# Patient Record
Sex: Female | Born: 1985 | Race: White | Hispanic: No | Marital: Married | Smoking: Former smoker
Health system: Southern US, Community
[De-identification: ages and names within clinical notes are randomized; demographics above are authoritative.]

## PROBLEM LIST (undated history)

## (undated) DIAGNOSIS — D561 Beta thalassemia: Secondary | ICD-10-CM

## (undated) DIAGNOSIS — T8859XA Other complications of anesthesia, initial encounter: Secondary | ICD-10-CM

## (undated) DIAGNOSIS — Z8679 Personal history of other diseases of the circulatory system: Secondary | ICD-10-CM

## (undated) DIAGNOSIS — O139 Gestational [pregnancy-induced] hypertension without significant proteinuria, unspecified trimester: Secondary | ICD-10-CM

## (undated) DIAGNOSIS — Q250 Patent ductus arteriosus: Secondary | ICD-10-CM

## (undated) DIAGNOSIS — R112 Nausea with vomiting, unspecified: Secondary | ICD-10-CM

## (undated) DIAGNOSIS — Z9889 Other specified postprocedural states: Secondary | ICD-10-CM

## (undated) DIAGNOSIS — B002 Herpesviral gingivostomatitis and pharyngotonsillitis: Secondary | ICD-10-CM

## (undated) DIAGNOSIS — J45909 Unspecified asthma, uncomplicated: Secondary | ICD-10-CM

## (undated) HISTORY — PX: TONSILLECTOMY: SUR1361

## (undated) HISTORY — DX: Unspecified asthma, uncomplicated: J45.909

## (undated) HISTORY — DX: Nausea with vomiting, unspecified: R11.2

## (undated) HISTORY — DX: Patent ductus arteriosus: Q25.0

## (undated) HISTORY — DX: Other complications of anesthesia, initial encounter: T88.59XA

## (undated) HISTORY — DX: Beta thalassemia: D56.1

## (undated) HISTORY — PX: CARDIAC SURGERY: SHX584

## (undated) HISTORY — PX: OTHER SURGICAL HISTORY: SHX169

## (undated) HISTORY — DX: Herpesviral gingivostomatitis and pharyngotonsillitis: B00.2

## (undated) HISTORY — DX: Gestational (pregnancy-induced) hypertension without significant proteinuria, unspecified trimester: O13.9

## (undated) HISTORY — DX: Personal history of other diseases of the circulatory system: Z86.79

## (undated) HISTORY — DX: Other specified postprocedural states: Z98.890

## (undated) HISTORY — PX: LAPAROSCOPY: SHX197

## (undated) HISTORY — PX: BUNIONECTOMY: SHX129

---

## 2016-02-18 DIAGNOSIS — O139 Gestational [pregnancy-induced] hypertension without significant proteinuria, unspecified trimester: Secondary | ICD-10-CM

## 2019-02-05 NOTE — L&D Delivery Note (Signed)
Alicia Allen is a 34 y.o. G2P1001 s/p SVD at [redacted]w[redacted]d. She was admitted for elective IOL.   ROM: 3h 93m with clear fluid GBS Status: Negative Maximum Maternal Temperature: 98.5  Labor Progress and Delivery: . She progressed with augmentation (cytotec and FB) to complete and pushed 48 minutes to deliver. Head delivered LOA. Nuchal cord present- loose, delivered through. Shoulder and body delivered in usual fashion. Infant with spontaneous cry, placed on mother's abdomen, dried and stimulated. Cord clamped x 2 after 1-minute delay, and cut by FOB-Chad. Cord blood drawn. Placenta delivered spontaneously with gentle cord traction. Fundus firm with massage and Pitocin. Labia, perineum, vagina, and cervix inspected inspected with 1st degree laceration. Laceration repaired without difficulty.  Mom and baby stable prior to transfer to postpartum. She plans on breastfeeding. She is unsure method for birth control.  Delivery Note At 4:08 AM a viable and healthy female was delivered via Vaginal, Spontaneous (Presentation: Left Occiput Anterior).  APGAR: 9, 9; weight pending.   Placenta status: Spontaneous, Intact.  Cord: 3 vessels with the following complications: None.   Anesthesia: Epidural Episiotomy: None Lacerations: 1st degree Suture Repair: 3.0 vicryl Est. Blood Loss (mL): 125  Mom to postpartum.  Baby to Couplet care / Skin to Skin.  Sharyon Cable CNM 12/13/2019, 4:45 AM

## 2019-05-13 ENCOUNTER — Encounter: Payer: Self-pay | Admitting: *Deleted

## 2019-05-14 ENCOUNTER — Other Ambulatory Visit (HOSPITAL_COMMUNITY)
Admission: RE | Admit: 2019-05-14 | Discharge: 2019-05-14 | Disposition: A | Discharge status: Home / Self Care | Payer: PRIVATE HEALTH INSURANCE | Source: Ambulatory Visit

## 2019-05-14 ENCOUNTER — Other Ambulatory Visit: Payer: Self-pay

## 2019-05-14 ENCOUNTER — Ambulatory Visit (INDEPENDENT_AMBULATORY_CARE_PROVIDER_SITE_OTHER): Payer: PRIVATE HEALTH INSURANCE

## 2019-05-14 VITALS — BP 120/71 | HR 74 | Temp 98.5°F | Ht 63.0 in | Wt 174.0 lb

## 2019-05-14 DIAGNOSIS — O99011 Anemia complicating pregnancy, first trimester: Secondary | ICD-10-CM

## 2019-05-14 DIAGNOSIS — Z3480 Encounter for supervision of other normal pregnancy, unspecified trimester: Secondary | ICD-10-CM | POA: Insufficient documentation

## 2019-05-14 DIAGNOSIS — O219 Vomiting of pregnancy, unspecified: Secondary | ICD-10-CM

## 2019-05-14 DIAGNOSIS — O99891 Other specified diseases and conditions complicating pregnancy: Secondary | ICD-10-CM

## 2019-05-14 DIAGNOSIS — O0990 Supervision of high risk pregnancy, unspecified, unspecified trimester: Secondary | ICD-10-CM

## 2019-05-14 DIAGNOSIS — R7989 Other specified abnormal findings of blood chemistry: Secondary | ICD-10-CM

## 2019-05-14 DIAGNOSIS — Z3A09 9 weeks gestation of pregnancy: Secondary | ICD-10-CM

## 2019-05-14 DIAGNOSIS — D561 Beta thalassemia: Secondary | ICD-10-CM

## 2019-05-14 DIAGNOSIS — Z124 Encounter for screening for malignant neoplasm of cervix: Secondary | ICD-10-CM | POA: Diagnosis not present

## 2019-05-14 DIAGNOSIS — O99411 Diseases of the circulatory system complicating pregnancy, first trimester: Secondary | ICD-10-CM

## 2019-05-14 DIAGNOSIS — I456 Pre-excitation syndrome: Secondary | ICD-10-CM

## 2019-05-14 LAB — OB RESULTS CONSOLE GC/CHLAMYDIA: Gonorrhea: NEGATIVE

## 2019-05-14 MED ORDER — DOXYLAMINE-PYRIDOXINE 10-10 MG PO TBEC: 2.0000 | DELAYED_RELEASE_TABLET | Freq: Every day | ORAL | 2 refills | Status: DC

## 2019-05-14 NOTE — Patient Instructions (Signed)

## 2019-05-14 NOTE — Progress Notes (Signed)
Bedside U/S shows single IUP with FHT of 164BPM and CRL measures 24.58mm  GA [redacted]w[redacted]d

## 2019-05-14 NOTE — Progress Notes (Signed)
Subjective:   Alicia Allen is a 34 y.o. G2P1000 at [redacted]w[redacted]d by [employer] being seen today for her first obstetrical visit.  Her obstetrical history is significant for pregnancy induced hypertension and "some low fluid after 24 weeks."  She reports a medical history significant for beta thalassemia and [place] white syndrome.  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  She states that when they went for repeat surgery in 2016 no findings were noted to be removed.  In regards to current symptoms, patient no pain or discomfort with urination or current diarrhea or constipation.  She states that she had "bad" constipation the first weeks of pregnancy, but that has since resolved. States bowel movement last night without issues.  Patient also reports nausea and vomiting and requests medication. Patient reports she is eating bland food, ginger ale, and crackers and continues to have nausea with eating, but limited vomiting "only 3x." She reports having some spotting that lasted one week, that started 2 days after sex. She endorses mild cramping, that did not require treatment. Patient states the blood ranged from red to brown to pink. No pad or pantyliner needed, just with wiping. Patient reports she also had some lightheadedness and heart palpitations, but this it may be work related.    Patient does intend to breast feed. Birth control method not addressed.  She is married to [place] who is supportive and has a 3 y.o. son Lyn Hollingshead.  Patient endorses safety at home and denies DVA.  She is currently employed at [employer] and reports things have been busy.   HISTORY: OB History  Gravida Para Term Preterm AB Living  2 1 1  0 0 0  SAB TAB Ectopic Multiple Live Births  0 0 0 0 1    # Outcome Date GA Lbr Len/2nd Weight Sex Delivery Anes PTL Lv  2 Current           1 Term             Last pap smear was done today.   Patient denies history of abnormal paps.   Past Medical History:  Diagnosis Date  . Asthma   . Beta thalassemia (HCC)   . Gestational hypertension   . History of Wolff-Parkinson-White syndrome    Past Surgical History:  Procedure Laterality Date  . BUNIONECTOMY    . CARDIAC SURGERY    . LAPAROSCOPY    . patent artero    . TONSILLECTOMY     Family History  Problem Relation Age of Onset  . Thalassemia Mother        Beta  . Multiple sclerosis Father   . Heart disease Father        Enlarged heart  . Breast cancer Maternal Grandmother   . Hypertension Maternal Grandmother   . Hypercholesterolemia Maternal Grandmother   . Heart attack Maternal Grandfather        multiple  . Diabetes Maternal Grandfather   . Breast cancer Paternal Grandmother   . Skin cancer Paternal Grandmother   . Huntington's disease Paternal Grandmother   . Fibromyalgia Paternal Grandmother   . Heart disease Paternal Grandfather    Social History   Tobacco Use  . Smoking status: Former  . Smokeless tobacco: Never Used  Substance Use Topics  . Alcohol use: Never  . Drug use: Never   Allergies  Allergen  Reactions  . Metoclopramide Anxiety    Severe panic attacks   Severe panic attacks      Current Outpatient Medications on File Prior to Visit  Medication Sig Dispense Refill  . acetaminophen (TYLENOL) 325 MG tablet Take by mouth.    Marland Kitchen albuterol (VENTOLIN HFA) 108 (90 Base) MCG/ACT inhaler Inhale into the lungs.    . folic acid (FOLVITE) 800 MCG tablet Take 400 mcg by mouth daily.    . Prenatal Vit-Fe Fumarate-FA (PRENATAL VITAMIN PO) Take by mouth.    . valACYclovir (VALTREX) 1000 MG tablet SMARTSIG:2 Tablet(s) By Mouth Every 12 Hours     No current facility-administered medications on file prior to visit.    Review of Systems Pertinent items noted in HPI and remainder of comprehensive ROS otherwise negative.  Exam   Vitals:   05/14/19 1016 05/14/19 1019  BP: 120/71   Pulse: 74    Temp: 98.5 F (36.9 C)   Weight: 174 lb (78.9 kg)   Height:  5\' 3"  (1.6 m)   Fetal Heart Rate (bpm): 164  Physical Exam Constitutional:      Appearance: Normal appearance.  Genitourinary:     Vulva normal.     Vaginal discharge present.     No vaginal erythema or bleeding.     Cervix is parous.     Cervical friability present.     No cervical motion tenderness, discharge, erythema, polyp or nabothian cyst.     Uterus is enlarged.     Genitourinary Comments: Small amt thin white discharge. Pap collected with brush and spatula causing bright red bleeding c/w friability.  No tenderness in cul de sac or adnexa area with BME. Size c/w dates.   HENT:     Head: Normocephalic and atraumatic.  Eyes:     Conjunctiva/sclera: Conjunctivae normal.  Neck:     Thyroid: No thyroid mass, thyromegaly or thyroid tenderness.     Trachea: Trachea normal. No tracheal tenderness.  Cardiovascular:     Rate and Rhythm: Normal rate and regular rhythm.     Heart sounds: Normal heart sounds.  Pulmonary:     Effort: Pulmonary effort is normal.     Breath sounds: Normal breath sounds.  Chest:     Breasts:        Right: Tenderness present. No mass, nipple discharge or skin change.        Left: Tenderness present. No mass, nipple discharge or skin change.     Comments: Tenderness c/w early pregnancy.  Abdominal:     General: Bowel sounds are normal.     Palpations: Abdomen is soft.     Tenderness: There is abdominal tenderness in the left lower quadrant. There is no guarding or rebound.  Musculoskeletal:     Cervical back: Normal range of motion.  Neurological:     Mental Status: She is alert.     Assessment:   34 y.o. year old G2P1000 Patient Active Problem List   Diagnosis Date Noted  . Supervision of high risk pregnancy, antepartum 05/14/2019     Plan:  1. Supervision of high risk pregnancy, antepartum -Congratulations given and patient welcomed to practice. -Discussed usage of  Babyscripts and virtual visits as additional source of managing and completing PN visits in midst of coronavirus.   -Anticipatory guidance for prenatal visits including labs, ultrasounds, and testing; Initial labs drawn. -Genetic Screening discussed, First trimester screen, Quad screen and NIPS: undecided. -Encouraged to complete MyChart Registration for her ability to review results,  send requests, and have questions addressed.  -Discussed estimated due date based of December 12, 2019. -Ultrasound discussed; fetal anatomic survey: discussed. -Continue prenatal vitamins.  -Encouraged to seek out care at office or emergency room for urgent and/or emergent concerns. -Educated on the nature of [place] with multiple MDs and other Advanced Practice Providers was explained to patient; also emphasized that residents, students are part of our team. Informed of her right to refuse care as she deems appropriate.  -No questions or concerns.   2. Cervical cancer screening -Pap collected -Informed that results would be released to mychart if activated.  If not, no news is good news.  3. Nausea and vomiting in pregnancy -Given information for morning sickness. -Rx for diclegis sent to pharmacy on file.   4. Beta thalassemia ([place]) -Reviewed how this can contribute to anemia in pregnancy. -Will monitor as appropriate and initiate iron supplements as needed. -Informed that lightheadedness and heart palpitations could also be due to low hgb.  5. Wolff-Parkinson-White (WPW) syndrome -Informed that provider not familiar with this syndrome and that it would require MD consult. -Patient questions if her pregnancy will be considered high risk and informed that currently it is a moderate to high risk d/t history. -Attempted to contact C. Pickens for phone consult, but unavailable. -Patient scheduled for ROB via VV with Dr. Loni Muse in 6 weeks. -Chart to be sent in advance for  review and mgmt as appropriate.  Problem list reviewed and updated. Routine obstetric precautions reviewed.  Orders Placed This Encounter  Procedures  . [place] bedside    Standing Status:   Future    Standing Expiration Date:   05/12/2020  . Culture, OB Urine  . Obstetric panel  . HIV antibody (with reflex)  . Hepatitis C Antibody  . Comprehensive metabolic panel  . Protein / creatinine ratio, urine    Return in about 6 weeks (around 06/25/2019) for LR-ROB via Virtual Visit.     Maryann Conners, CNM 05/14/2019 11:05 AM

## 2019-05-16 LAB — CULTURE, OB URINE

## 2019-05-16 LAB — URINE CULTURE, OB REFLEX: Organism ID, Bacteria: NO GROWTH

## 2019-05-17 ENCOUNTER — Other Ambulatory Visit: Payer: Self-pay | Admitting: *Deleted

## 2019-05-17 DIAGNOSIS — I456 Pre-excitation syndrome: Secondary | ICD-10-CM

## 2019-05-17 LAB — OBSTETRIC PANEL
Absolute Monocytes: 584 cells/uL (ref 200–950)
Antibody Screen: NOT DETECTED
Basophils Absolute: 79 cells/uL (ref 0–200)
Basophils Relative: 0.8 %
Eosinophils Absolute: 317 cells/uL (ref 15–500)
Eosinophils Relative: 3.2 %
HCT: 34.3 % — ABNORMAL LOW (ref 35.0–45.0)
Hemoglobin: 10.7 g/dL — ABNORMAL LOW (ref 11.7–15.5)
Hepatitis B Surface Ag: NONREACTIVE
Lymphs Abs: 2336 cells/uL (ref 850–3900)
MCH: 19.5 pg — ABNORMAL LOW (ref 27.0–33.0)
MCHC: 31.2 g/dL — ABNORMAL LOW (ref 32.0–36.0)
MCV: 62.4 fL — ABNORMAL LOW (ref 80.0–100.0)
MPV: 10.8 fL (ref 7.5–12.5)
Monocytes Relative: 5.9 %
Neutro Abs: 6584 cells/uL (ref 1500–7800)
Neutrophils Relative %: 66.5 %
Platelets: 351 10*3/uL (ref 140–400)
RBC: 5.5 10*6/uL — ABNORMAL HIGH (ref 3.80–5.10)
RDW: 16.2 % — ABNORMAL HIGH (ref 11.0–15.0)
RPR Ser Ql: NONREACTIVE
Rubella: 3.24 Index
Total Lymphocyte: 23.6 %
WBC: 9.9 10*3/uL (ref 3.8–10.8)

## 2019-05-17 LAB — GC/CHLAMYDIA PROBE AMP (~~LOC~~) NOT AT ARMC
Chlamydia: NEGATIVE
Comment: NEGATIVE
Comment: NORMAL
Neisseria Gonorrhea: NEGATIVE

## 2019-05-17 LAB — COMPREHENSIVE METABOLIC PANEL
AG Ratio: 1.7 (calc) (ref 1.0–2.5)
ALT: 11 U/L (ref 6–29)
AST: 13 U/L (ref 10–30)
Albumin: 4.4 g/dL (ref 3.6–5.1)
Alkaline phosphatase (APISO): 50 U/L (ref 31–125)
BUN/Creatinine Ratio: 26 (calc) — ABNORMAL HIGH (ref 6–22)
BUN: 11 mg/dL (ref 7–25)
CO2: 19 mmol/L — ABNORMAL LOW (ref 20–32)
Calcium: 9.2 mg/dL (ref 8.6–10.2)
Chloride: 103 mmol/L (ref 98–110)
Creat: 0.42 mg/dL — ABNORMAL LOW (ref 0.50–1.10)
Globulin: 2.6 g/dL (calc) (ref 1.9–3.7)
Glucose, Bld: 53 mg/dL — ABNORMAL LOW (ref 65–99)
Potassium: 3.7 mmol/L (ref 3.5–5.3)
Sodium: 136 mmol/L (ref 135–146)
Total Bilirubin: 0.5 mg/dL (ref 0.2–1.2)
Total Protein: 7 g/dL (ref 6.1–8.1)

## 2019-05-17 LAB — HEPATITIS C ANTIBODY
Hepatitis C Ab: NONREACTIVE
SIGNAL TO CUT-OFF: 0.01 (ref <1.00)

## 2019-05-17 LAB — PROTEIN / CREATININE RATIO, URINE
Creatinine, Urine: 128 mg/dL (ref 20–275)
Protein/Creat Ratio: 70 mg/g creat (ref 21–161)
Protein/Creatinine Ratio: 0.07 mg/mg creat (ref 0.021–0.16)
Total Protein, Urine: 9 mg/dL (ref 5–24)

## 2019-05-17 LAB — HIV ANTIBODY (ROUTINE TESTING W REFLEX): HIV 1&2 Ab, 4th Generation: NONREACTIVE

## 2019-05-17 LAB — SPECIMEN COMPROMISED

## 2019-05-17 NOTE — Progress Notes (Signed)
Ambulatory referral to cardiology per Gerrit Heck CNM

## 2019-05-19 ENCOUNTER — Encounter: Payer: Self-pay | Admitting: Obstetrics & Gynecology

## 2019-05-19 DIAGNOSIS — O99019 Anemia complicating pregnancy, unspecified trimester: Secondary | ICD-10-CM | POA: Insufficient documentation

## 2019-05-19 DIAGNOSIS — O99011 Anemia complicating pregnancy, first trimester: Secondary | ICD-10-CM | POA: Insufficient documentation

## 2019-05-19 LAB — CYTOLOGY - PAP
Comment: NEGATIVE
Diagnosis: NEGATIVE
High risk HPV: NEGATIVE

## 2019-05-20 DIAGNOSIS — R7989 Other specified abnormal findings of blood chemistry: Secondary | ICD-10-CM | POA: Insufficient documentation

## 2019-05-20 MED ORDER — FERROUS SULFATE 325 (65 FE) MG PO TBEC: 325.0000 mg | DELAYED_RELEASE_TABLET | Freq: Every day | ORAL | 3 refills | Status: DC

## 2019-05-20 NOTE — Addendum Note (Signed)
Addended by: Gerrit Heck L on: 05/20/2019 09:09 AM   Modules accepted: Orders

## 2019-06-21 ENCOUNTER — Encounter: Payer: Self-pay | Admitting: Obstetrics & Gynecology

## 2019-06-21 ENCOUNTER — Ambulatory Visit (INDEPENDENT_AMBULATORY_CARE_PROVIDER_SITE_OTHER): Payer: PRIVATE HEALTH INSURANCE | Admitting: Obstetrics & Gynecology

## 2019-06-21 ENCOUNTER — Other Ambulatory Visit: Payer: Self-pay

## 2019-06-21 VITALS — BP 122/70 | HR 74 | Wt 179.0 lb

## 2019-06-21 DIAGNOSIS — O0990 Supervision of high risk pregnancy, unspecified, unspecified trimester: Secondary | ICD-10-CM

## 2019-06-21 DIAGNOSIS — I456 Pre-excitation syndrome: Secondary | ICD-10-CM

## 2019-06-21 DIAGNOSIS — Z3A15 15 weeks gestation of pregnancy: Secondary | ICD-10-CM

## 2019-06-21 NOTE — Progress Notes (Signed)
   PRENATAL VISIT NOTE  Subjective:  Alicia Allen is a 34 y.o. G2P1000 at [redacted]w[redacted]d being seen today for ongoing prenatal care.  She is currently monitored for the following issues for this high-risk pregnancy and has Supervision of high risk pregnancy, antepartum; Wolff-Parkinson-White (WPW) syndrome; Beta thalassemia (HCC); Anemia affecting pregnancy in first trimester; and Low serum creatinine on their problem list.  Patient reports feeling lightheaded and having palpitations.   . Vag. Bleeding: None.  Movement: Absent. Denies leaking of fluid.   The following portions of the patient's history were reviewed and updated as appropriate: allergies, current medications, past family history, past medical history, past social history, past surgical history and problem list.   Objective:   Vitals:   06/21/19 1402  BP: 122/70  Pulse: 74  Weight: 179 lb (81.2 kg)    Fetal Status:     Movement: Absent     General:  Alert, oriented and cooperative. Patient is in no acute distress.  Skin: Skin is warm and dry. No rash noted.   Cardiovascular: Normal heart rate noted  Respiratory: Normal respiratory effort, no problems with respiration noted  Abdomen: Soft, gravid, appropriate for gestational age.        Pelvic: Cervical exam deferred        Extremities: Normal range of motion.  Edema: None  Mental Status: Normal mood and affect. Normal behavior. Normal judgment and thought content.   Assessment and Plan:  Pregnancy: G2P1000 at [redacted]w[redacted]d 1. Wolff-Parkinson-White (WPW) syndrome Already referred to Cardiology, has appointment on 07/07/2019 with Dr. Jens Som. Will also evaluate for reported occasional palpitations.  2. [redacted] weeks gestation of pregnancy Genetic screening, AFP checked today. - Genetic Screening - Alpha fetoprotein, maternal  3. Supervision of high risk pregnancy, antepartum Anatomy scan scheduled. - Genetic Screening - Alpha fetoprotein, maternal - [place] MFM OB COMP + 14 WK; Future No  other complaints or concerns.  Routine obstetric precautions reviewed.  Please refer to After Visit Summary for other counseling recommendations.   Return in about 4 weeks (around 07/19/2019) for OFFICE OB Visit.  Future Appointments  Date Time Provider Department Center  07/07/2019  2:00 PM Crenshaw, Madolyn Frieze, MD [facility] None    Jaynie Collins, MD

## 2019-06-21 NOTE — Patient Instructions (Addendum)
Return to office for any scheduled appointments. Call the office or go to the MAU at [place] at [facility] if:  You begin to have strong, frequent contractions  Your water breaks.  Sometimes it is a big gush of fluid, sometimes it is just a trickle that keeps getting your panties wet or running down your legs  You have vaginal bleeding.  It is normal to have a small amount of spotting if your cervix was checked.   Any other obstetric concerns.   Second Trimester of Pregnancy The second trimester is from week 14 through week 27 (months 4 through 6). The second trimester is often a time when you feel your best. Your body has adjusted to being pregnant, and you begin to feel better physically. Usually, morning sickness has lessened or quit completely, you may have more energy, and you may have an increase in appetite. The second trimester is also a time when the fetus is growing rapidly. At the end of the sixth month, the fetus is about 9 inches long and weighs about 1 pounds. You will likely begin to feel the baby move (quickening) between 16 and 20 weeks of pregnancy. Body changes during your second trimester Your body continues to go through many changes during your second trimester. The changes vary from woman to woman.  Your weight will continue to increase. You will notice your lower abdomen bulging out.  You may begin to get stretch marks on your hips, abdomen, and breasts.  You may develop headaches that can be relieved by medicines. The medicines should be approved by your health care provider.  You may urinate more often because the fetus is pressing on your bladder.  You may develop or continue to have heartburn as a result of your pregnancy.  You may develop constipation because certain hormones are causing the muscles that push waste through your intestines to slow down.  You may develop hemorrhoids or swollen, bulging veins (varicose veins).  You may have  back pain. This is caused by: ? Weight gain. ? Pregnancy hormones that are relaxing the joints in your pelvis. ? A shift in weight and the muscles that support your balance.  Your breasts will continue to grow and they will continue to become tender.  Your gums may bleed and may be sensitive to brushing and flossing.  Dark spots or blotches (chloasma, mask of pregnancy) may develop on your face. This will likely fade after the baby is born.  A dark line from your belly button to the pubic area (linea nigra) may appear. This will likely fade after the baby is born.  You may have changes in your hair. These can include thickening of your hair, rapid growth, and changes in texture. Some women also have hair loss during or after pregnancy, or hair that feels dry or thin. Your hair will most likely return to normal after your baby is born. What to expect at prenatal visits During a routine prenatal visit:  You will be weighed to make sure you and the fetus are growing normally.  Your blood pressure will be taken.  Your abdomen will be measured to track your baby's growth.  The fetal heartbeat will be listened to.  Any test results from the previous visit will be discussed. Your health care provider may ask you:  How you are feeling.  If you are feeling the baby move.  If you have had any abnormal symptoms, such as leaking fluid, bleeding,  severe headaches, or abdominal cramping.  If you are using any tobacco products, including cigarettes, chewing tobacco, and electronic cigarettes.  If you have any questions. Other tests that may be performed during your second trimester include:  Blood tests that check for: ? Low iron levels (anemia). ? High blood sugar that affects pregnant women (gestational diabetes) between 28 and 28 weeks. ? Rh antibodies. This is to check for a protein on red blood cells (Rh factor).  Urine tests to check for infections, diabetes, or protein in the  urine.  An ultrasound to confirm the proper growth and development of the baby.  An amniocentesis to check for possible genetic problems.  Fetal screens for spina bifida and Down syndrome.  HIV (human immunodeficiency virus) testing. Routine prenatal testing includes screening for HIV, unless you choose not to have this test. Follow these instructions at home: Medicines  Follow your health care provider's instructions regarding medicine use. Specific medicines may be either safe or unsafe to take during pregnancy.  Take a prenatal vitamin that contains at least 600 micrograms (mcg) of folic acid.  If you develop constipation, try taking a stool softener if your health care provider approves. Eating and drinking   Eat a balanced diet that includes fresh fruits and vegetables, whole grains, good sources of protein such as meat, eggs, or tofu, and low-fat dairy. Your health care provider will help you determine the amount of weight gain that is right for you.  Avoid raw meat and uncooked cheese. These carry germs that can cause birth defects in the baby.  If you have low calcium intake from food, talk to your health care provider about whether you should take a daily calcium supplement.  Limit foods that are high in fat and processed sugars, such as fried and sweet foods.  To prevent constipation: ? Drink enough fluid to keep your urine clear or pale yellow. ? Eat foods that are high in fiber, such as fresh fruits and vegetables, whole grains, and beans. Activity  Exercise only as directed by your health care provider. Most women can continue their usual exercise routine during pregnancy. Try to exercise for 30 minutes at least 5 days a week. Stop exercising if you experience uterine contractions.  Avoid heavy lifting, wear low heel shoes, and practice good posture.  A sexual relationship may be continued unless your health care provider directs you otherwise. Relieving pain and  discomfort  Wear a good support bra to prevent discomfort from breast tenderness.  Take warm sitz baths to soothe any pain or discomfort caused by hemorrhoids. Use hemorrhoid cream if your health care provider approves.  Rest with your legs elevated if you have leg cramps or low back pain.  If you develop varicose veins, wear support hose. Elevate your feet for 15 minutes, 3-4 times a day. Limit salt in your diet. Prenatal Care  Write down your questions. Take them to your prenatal visits.  Keep all your prenatal visits as told by your health care provider. This is important. Safety  Wear your seat belt at all times when driving.  Make a list of emergency phone numbers, including numbers for family, friends, the hospital, and police and fire departments. General instructions  Ask your health care provider for a referral to a local prenatal education class. Begin classes no later than the beginning of month 6 of your pregnancy.  Ask for help if you have counseling or nutritional needs during pregnancy. Your health care provider can offer  advice or refer you to specialists for help with various needs.  Do not use hot tubs, steam rooms, or saunas.  Do not douche or use tampons or scented sanitary pads.  Do not cross your legs for long periods of time.  Avoid cat litter boxes and soil used by cats. These carry germs that can cause birth defects in the baby and possibly loss of the fetus by miscarriage or stillbirth.  Avoid all smoking, herbs, alcohol, and unprescribed drugs. Chemicals in these products can affect the formation and growth of the baby.  Do not use any products that contain nicotine or tobacco, such as cigarettes and e-cigarettes. If you need help quitting, ask your health care provider.  Visit your dentist if you have not gone yet during your pregnancy. Use a soft toothbrush to brush your teeth and be gentle when you floss. Contact a health care provider if:  You  have dizziness.  You have mild pelvic cramps, pelvic pressure, or nagging pain in the abdominal area.  You have persistent nausea, vomiting, or diarrhea.  You have a bad smelling vaginal discharge.  You have pain when you urinate. Get help right away if:  You have a fever.  You are leaking fluid from your vagina.  You have spotting or bleeding from your vagina.  You have severe abdominal cramping or pain.  You have rapid weight gain or weight loss.  You have shortness of breath with chest pain.  You notice sudden or extreme swelling of your face, hands, ankles, feet, or legs.  You have not felt your baby move in over an hour.  You have severe headaches that do not go away when you take medicine.  You have vision changes. Summary  The second trimester is from week 14 through week 27 (months 4 through 6). It is also a time when the fetus is growing rapidly.  Your body goes through many changes during pregnancy. The changes vary from woman to woman.  Avoid all smoking, herbs, alcohol, and unprescribed drugs. These chemicals affect the formation and growth your baby.  Do not use any tobacco products, such as cigarettes, chewing tobacco, and e-cigarettes. If you need help quitting, ask your health care provider.  Contact your health care provider if you have any questions. Keep all prenatal visits as told by your health care provider. This is important. This information is not intended to replace advice given to you by your health care provider. Make sure you discuss any questions you have with your health care provider. Document Revised: 05/15/2018 Document Reviewed: 02/27/2016 Elsevier Patient Education  2020 [employer].

## 2019-06-22 LAB — ALPHA FETOPROTEIN, MATERNAL
AFP MoM: 0.72
AFP, Serum: 18.7 ng/mL
Calc'd Gestational Age: 15.1 weeks
Maternal Wt: 179 [lb_av]
Risk for ONTD: <1
Twins-AFP: 1

## 2019-06-29 ENCOUNTER — Encounter: Payer: Self-pay | Admitting: *Deleted

## 2019-06-29 DIAGNOSIS — O0990 Supervision of high risk pregnancy, unspecified, unspecified trimester: Secondary | ICD-10-CM

## 2019-07-06 NOTE — Progress Notes (Signed)
Referring-Jessica Emly CNM Reason for referral-WPW  HPI: 34 year old female for evaluation of WPW at request of Gavin Pound, CNM.  Patient is [redacted] weeks pregnant.  Patient has a history of ablation in 2008 by Dr. Ola Spurr at [facility].  She had a posterior septal location requiring lesions on both sides of the septum.  Echocardiogram in 2011 showed normal LV function.  She had recurrent symptoms in 2012.  EPS was unremarkable.  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  Patient has a history of recurrent palpitations felt to be PVCs since her previous ablation.  Tolerated her first pregnancy in 2017.  Patient typically does not have dyspnea on exertion, orthopnea, PND, pedal edema, exertional chest pain or syncope.  Over the past 2 to 4 weeks she has had worsening palpitations.  They are described as her heart racing.  There is associated dyspnea and chest tightness.  Cardiology now asked to evaluate.  Current Outpatient Medications  Medication Sig Dispense Refill   acetaminophen (TYLENOL) 325 MG tablet Take 325 mg by mouth every 6 (six) hours as needed for mild pain.      albuterol (VENTOLIN HFA) 108 (90 Base) MCG/ACT inhaler Inhale 1 puff into the lungs every 6 (six) hours as needed for wheezing.      folic acid (FOLVITE) 338 MCG tablet Take 400 mcg by mouth daily.     Prenatal Vit-Fe Fumarate-FA (PRENATAL VITAMIN PO) Take by mouth.     valACYclovir (VALTREX) 1000 MG tablet Take 1,000 mg by mouth as directed.      No current facility-administered medications for this visit.    Allergies  Allergen Reactions   Metoclopramide Anxiety    Severe panic attacks   Severe panic attacks        Past Medical History:  Diagnosis Date   Asthma    Beta thalassemia (HCC)    Gestational hypertension    History of Wolff-Parkinson-White syndrome    Oral herpes simplex infection    Valtrex as needed. Never had genital lesions.    Past Surgical  History:  Procedure Laterality Date   BUNIONECTOMY     CARDIAC SURGERY     LAPAROSCOPY     patent artero     TONSILLECTOMY      Social History   Socioeconomic History   Marital status: Married    Spouse name: Not on file   Number of children: 1   Years of education: Not on file   Highest education level: Not on file  Occupational History   Occupation: Glass blower/designer  Tobacco Use   Smoking status: Former Smoker   Smokeless tobacco: Never Used  Substance and Sexual Activity   Alcohol use: Yes    Comment: Not since pregnant   Drug use: Never   Sexual activity: Yes    Birth control/protection: None  Other Topics Concern   Not on file  Social History Narrative   Not on file   Social Determinants of Health   Financial Resource Strain:    Difficulty of Paying Living Expenses:   Food Insecurity:    Worried About Charity fundraiser in the Last Year:    Arboriculturist in the Last Year:   Transportation Needs:    Film/video editor (Medical):    Lack of Transportation (Non-Medical):   Physical Activity:    Days of Exercise per Week:    Minutes of Exercise per Session:   Stress:  Feeling of Stress :   Social Connections:    Frequency of Communication with Friends and Family:    Frequency of Social Gatherings with Friends and Family:    Attends Religious Services:    Active Member of Clubs or Organizations:    Attends Engineer, structural:    Marital Status:   Intimate Partner Violence:    Fear of Current or Ex-Partner:    Emotionally Abused:    Physically Abused:    Sexually Abused:     Family History  Problem Relation Age of Onset   Thalassemia Mother        Beta   Multiple sclerosis Father    Heart disease Father        Enlarged heart   Breast cancer Maternal Grandmother    Hypertension Maternal Grandmother    Hypercholesterolemia Maternal Grandmother    Heart attack Maternal Grandfather         multiple   Diabetes Maternal Grandfather    Breast cancer Paternal Grandmother    Skin cancer Paternal Grandmother    Huntington's disease Paternal Grandmother    Fibromyalgia Paternal Grandmother    Heart disease Paternal Grandfather     ROS: no fevers or chills, productive cough, hemoptysis, dysphasia, odynophagia, melena, hematochezia, dysuria, hematuria, rash, seizure activity, orthopnea, PND, pedal edema, claudication. Remaining systems are negative.  Physical Exam:   Blood pressure 114/77, pulse 78, height 5\' 3"  (1.6 m), weight 183 lb 12.8 oz (83.4 kg), last menstrual period 03/07/2019.  General:  Well developed/well nourished in NAD Skin warm/dry Patient not depressed No peripheral clubbing Back-normal HEENT-normal/normal eyelids Neck supple/normal carotid upstroke bilaterally; no bruits; no JVD; no thyromegaly chest - CTA/ normal expansion CV - RRR/normal S1 and S2; no rubs or gallops;  PMI nondisplaced; 1/6 systolic ejection murmur Abdomen -NT/ND, no HSM, + bowel sounds, no bruit, intrauterine pregnancy 2+ femoral pulses, no bruits Ext-no edema, chords, 2+ DP Neuro-grossly nonfocal  ECG -sinus rhythm at a rate of 78, nonspecific ST changes.  Personally reviewed  A/P  1 WPW-patient is status post ablation in 2008.  She has had occasional palpitations since then but follow-up EP study 2012 unremarkable.  No delta wave noted on electrocardiogram.  We will arrange an event monitor to rule out recurrent arrhythmias.  Schedule echocardiogram to assess LV function.  2 palpitations-as outlined above we will plan event monitor to further assess.  3 history of PDA closure-we will repeat echocardiogram.  4 17-week intrauterine pregnancy-Per OB/GYN  2013, MD

## 2019-07-07 ENCOUNTER — Other Ambulatory Visit: Payer: Self-pay

## 2019-07-07 ENCOUNTER — Encounter: Payer: Self-pay | Admitting: Cardiology

## 2019-07-07 ENCOUNTER — Ambulatory Visit (INDEPENDENT_AMBULATORY_CARE_PROVIDER_SITE_OTHER): Payer: PRIVATE HEALTH INSURANCE | Admitting: Cardiology

## 2019-07-07 ENCOUNTER — Telehealth: Payer: Self-pay | Admitting: Radiology

## 2019-07-07 VITALS — BP 114/77 | HR 78 | Ht 63.0 in | Wt 183.8 lb

## 2019-07-07 DIAGNOSIS — R002 Palpitations: Secondary | ICD-10-CM | POA: Diagnosis not present

## 2019-07-07 DIAGNOSIS — I456 Pre-excitation syndrome: Secondary | ICD-10-CM

## 2019-07-07 DIAGNOSIS — Z8774 Personal history of (corrected) congenital malformations of heart and circulatory system: Secondary | ICD-10-CM | POA: Diagnosis not present

## 2019-07-07 NOTE — Patient Instructions (Signed)
Medication Instructions:  NO CHANGE *If you need a refill on your cardiac medications before your next appointment, please call your pharmacy*   Lab Work: If you have labs (blood work) drawn today and your tests are completely normal, you will receive your results only by: Marland Kitchen MyChart Message (if you have MyChart) OR . A paper copy in the mail If you have any lab test that is abnormal or we need to change your treatment, we will call you to review the results.   Testing/Procedures:  Your physician has requested that you have an echocardiogram. Echocardiography is a painless test that uses sound waves to create images of your heart. It provides your doctor with information about the size and shape of your heart and how well your heart's chambers and valves are working. This procedure takes approximately one hour. There are no restrictions for this procedure.[place] [facility]  ZIO XT- Long Term Monitor Instructions   Your physician has requested you wear your ZIO patch monitor___14____days.   This is a single patch monitor.  [employer] supplies one patch monitor per enrollment.  Additional stickers are not available.   Please do not apply patch if you will be having a Nuclear Stress Test, Echocardiogram, Cardiac CT, MRI, or Chest Xray during the time frame you would be wearing the monitor. The patch cannot be worn during these tests.  You cannot remove and re-apply the ZIO XT patch monitor.   Your ZIO patch monitor will be sent USPS Priority mail from [facility] directly to your home address. The monitor may also be mailed to a PO BOX if home delivery is not available.   It may take 3-5 days to receive your monitor after you have been enrolled.   Once you have received you monitor, please review enclosed instructions.  Your monitor has already been registered assigning a specific monitor serial # to you.   Applying the monitor   Shave hair from upper left chest.   Hold abrader disc  by orange tab.  Rub abrader in 40 strokes over left upper chest as indicated in your monitor instructions.   Clean area with 4 enclosed alcohol pads .  Use all pads to assure are is cleaned thoroughly.  Let dry.   Apply patch as indicated in monitor instructions.  Patch will be place under collarbone on left side of chest with arrow pointing upward.   Rub patch adhesive wings for 2 minutes.Remove white label marked "1".  Remove white label marked "2".  Rub patch adhesive wings for 2 additional minutes.   While looking in a mirror, press and release button in center of patch.  A small green light will flash 3-4 times .  This will be your only indicator the monitor has been turned on.     Do not shower for the first 24 hours.  You may shower after the first 24 hours.   Press button if you feel a symptom. You will hear a small click.  Record Date, Time and Symptom in the Patient Log Book.   When you are ready to remove patch, follow instructions on last 2 pages of Patient Log Book.  Stick patch monitor onto last page of Patient Log Book.   Place Patient Log Book in [place] box.  Use locking tab on box and tape box closed securely.  The [employer] and [employer] has [employer] on it.  Please place in mailbox as soon as possible.  Your physician should have your  test results approximately 7 days after the monitor has been mailed back to [facility].   Call [facility] Customer Care at [phone] if you have questions regarding your ZIO XT patch monitor.  Call them immediately if you see an orange light blinking on your monitor.   If your monitor falls off in less than 4 days contact our Monitor department at [phone].  If your monitor becomes loose or falls off after 4 days call [employer] at [phone] for suggestions on securing your monitor.        Follow-Up: At [facility], you and your health needs are our priority.  As part of our continuing mission to provide you with  exceptional heart care, we have created designated Provider Care Teams.  These Care Teams include your primary Cardiologist (physician) and Advanced Practice Providers (APPs -  Physician Assistants and Nurse Practitioners) who all work together to provide you with the care you need, when you need it.  We recommend signing up for the patient portal called "MyChart".  Sign up information is provided on this After Visit Summary.  MyChart is used to connect with patients for Virtual Visits (Telemedicine).  Patients are able to view lab/test results, encounter notes, upcoming appointments, etc.  Non-urgent messages can be sent to your provider as well.   To learn more about what you can do with MyChart, go to [email or web address].    Your next appointment:   8 week(s)  The format for your next appointment:   In Person  Provider:   Olga Millers, MD

## 2019-07-07 NOTE — Telephone Encounter (Signed)
Enrolled patient for a 14 day Zio monitor to be mailed to patients home.  

## 2019-07-10 ENCOUNTER — Other Ambulatory Visit (INDEPENDENT_AMBULATORY_CARE_PROVIDER_SITE_OTHER): Payer: PRIVATE HEALTH INSURANCE

## 2019-07-10 DIAGNOSIS — R002 Palpitations: Secondary | ICD-10-CM

## 2019-07-19 ENCOUNTER — Encounter: Payer: PRIVATE HEALTH INSURANCE | Admitting: Obstetrics and Gynecology

## 2019-07-20 ENCOUNTER — Ambulatory Visit: Payer: PRIVATE HEALTH INSURANCE

## 2019-07-28 ENCOUNTER — Ambulatory Visit: Payer: PRIVATE HEALTH INSURANCE | Attending: Obstetrics and Gynecology

## 2019-07-28 ENCOUNTER — Other Ambulatory Visit: Payer: Self-pay | Admitting: *Deleted

## 2019-07-28 ENCOUNTER — Other Ambulatory Visit: Payer: Self-pay

## 2019-07-28 ENCOUNTER — Ambulatory Visit: Payer: PRIVATE HEALTH INSURANCE | Admitting: *Deleted

## 2019-07-28 ENCOUNTER — Other Ambulatory Visit: Payer: Self-pay | Admitting: Obstetrics & Gynecology

## 2019-07-28 DIAGNOSIS — Z3A20 20 weeks gestation of pregnancy: Secondary | ICD-10-CM | POA: Diagnosis not present

## 2019-07-28 DIAGNOSIS — Z87798 Personal history of other (corrected) congenital malformations: Secondary | ICD-10-CM

## 2019-07-28 DIAGNOSIS — Z3A15 15 weeks gestation of pregnancy: Secondary | ICD-10-CM | POA: Insufficient documentation

## 2019-07-28 DIAGNOSIS — O0990 Supervision of high risk pregnancy, unspecified, unspecified trimester: Secondary | ICD-10-CM | POA: Diagnosis present

## 2019-07-28 DIAGNOSIS — Z362 Encounter for other antenatal screening follow-up: Secondary | ICD-10-CM

## 2019-07-28 DIAGNOSIS — Z363 Encounter for antenatal screening for malformations: Secondary | ICD-10-CM

## 2019-07-28 DIAGNOSIS — O09292 Supervision of pregnancy with other poor reproductive or obstetric history, second trimester: Secondary | ICD-10-CM

## 2019-07-28 DIAGNOSIS — O09299 Supervision of pregnancy with other poor reproductive or obstetric history, unspecified trimester: Secondary | ICD-10-CM | POA: Insufficient documentation

## 2019-07-29 ENCOUNTER — Ambulatory Visit (INDEPENDENT_AMBULATORY_CARE_PROVIDER_SITE_OTHER): Payer: PRIVATE HEALTH INSURANCE | Admitting: Obstetrics & Gynecology

## 2019-07-29 ENCOUNTER — Encounter: Payer: Self-pay | Admitting: Obstetrics & Gynecology

## 2019-07-29 VITALS — BP 115/77 | HR 76 | Wt 184.0 lb

## 2019-07-29 DIAGNOSIS — Z3A20 20 weeks gestation of pregnancy: Secondary | ICD-10-CM

## 2019-07-29 DIAGNOSIS — I456 Pre-excitation syndrome: Secondary | ICD-10-CM

## 2019-07-29 DIAGNOSIS — Z8759 Personal history of other complications of pregnancy, childbirth and the puerperium: Secondary | ICD-10-CM

## 2019-07-29 DIAGNOSIS — O0992 Supervision of high risk pregnancy, unspecified, second trimester: Secondary | ICD-10-CM

## 2019-07-29 DIAGNOSIS — R21 Rash and other nonspecific skin eruption: Secondary | ICD-10-CM

## 2019-07-29 DIAGNOSIS — O99412 Diseases of the circulatory system complicating pregnancy, second trimester: Secondary | ICD-10-CM

## 2019-07-29 DIAGNOSIS — O0990 Supervision of high risk pregnancy, unspecified, unspecified trimester: Secondary | ICD-10-CM

## 2019-07-29 MED ORDER — ASPIRIN EC 81 MG PO TBEC: 81.0000 mg | DELAYED_RELEASE_TABLET | Freq: Every day | ORAL | 2 refills | Status: DC

## 2019-07-29 NOTE — Patient Instructions (Signed)
Return to office for any scheduled appointments. Call the office or go to the MAU at [facility] if:  You begin to have strong, frequent contractions  Your water breaks.  Sometimes it is a big gush of fluid, sometimes it is just a trickle that keeps getting your panties wet or running down your legs  You have vaginal bleeding.  It is normal to have a small amount of spotting if your cervix was checked.   You do not feel your baby moving like normal.  If you do not, get something to eat and drink and lay down and focus on feeling your baby move.   If your baby is still not moving like normal, you should call the office or go to MAU.  Any other obstetric concerns.   

## 2019-07-29 NOTE — Progress Notes (Signed)
PRENATAL VISIT NOTE  Subjective:  Alicia Allen is a 34 y.o. G2P1001 at [redacted]w[redacted]d being seen today for ongoing prenatal care.  She is currently monitored for the following issues for this high-risk pregnancy and has Supervision of high risk pregnancy, antepartum; Wolff-Parkinson-White (WPW) syndrome; Beta thalassemia (HCC); Anemia affecting pregnancy in first trimester; and History of gestational hypertension on their problem list.  Patient reports diffuse rash on torso and arms that is itchy. Had a lot of sun exposure recently..  Contractions: Not present. Vag. Bleeding: None.  Movement: Present. Denies leaking of fluid.   The following portions of the patient's history were reviewed and updated as appropriate: allergies, current medications, past family history, past medical history, past social history, past surgical history and problem list.   Objective:   Vitals:   07/29/19 1402  BP: 115/77  Pulse: 76  Weight: 184 lb (83.5 kg)    Fetal Status: Fetal Heart Rate (bpm): 140   Movement: Present     General:  Alert, oriented and cooperative. Patient is in no acute distress.  Skin: Skin is warm and dry. Diffuse, papular rash noted, no erythema. Resembles miliaria, not really PUPPS.   Cardiovascular: Normal heart rate noted  Respiratory: Normal respiratory effort, no problems with respiration noted  Abdomen: Soft, gravid, appropriate for gestational age.  Pain/Pressure: Absent     Pelvic: Cervical exam deferred        Extremities: Normal range of motion.  Edema: None  Mental Status: Normal mood and affect. Normal behavior. Normal judgment and thought content.   Imaging: [place] MFM OB DETAIL +14 WK  Result Date: 07/28/2019 ----------------------------------------------------------------------  OBSTETRICS REPORT                       (Signed Final 07/28/2019 03:30 pm) ---------------------------------------------------------------------- Patient Info  ID #:       [id number]                           D.O.B.:  1985-02-15 (33 yrs)  Name:       Alicia Allen                      Visit Date: 07/28/2019 01:52 pm ---------------------------------------------------------------------- Performed By  Attending:        Noralee Space MD        Ref. Address:     [address]                                                             [place], [place]  [address]  Performed By:     Tomma Lightning             [facility]                                 [facility]  Referred By:      Tereso Newcomer MD ---------------------------------------------------------------------- Orders  #  Description                           Code        Ordered By  1  [place] MFM OB DETAIL +14 WK               53664.40    Jaynie Collins ----------------------------------------------------------------------  #  Order #                     Accession #                Episode #  1  [id number]                   [id number]                 [id number] ---------------------------------------------------------------------- Indications  History of congenital or genetic condition     Z87.798  (WPW, S/P ablation 2008)  [redacted] weeks gestation of pregnancy                Z3A.20  Encounter for antenatal screening for          Z36.3  malformations  Poor obstetric history: Previous               O09.299  preeclampsia / eclampsia/gestational HTN  Beta thalassemia  LR NIPS, nml AFP ---------------------------------------------------------------------- Vital Signs                                                 Height:        5'3" ---------------------------------------------------------------------- Fetal Evaluation  Num Of Fetuses:         1  Fetal Heart Rate(bpm):  155  Cardiac Activity:       Observed  Presentation:           Variable  Placenta:               Anterior  P. Cord Insertion:       Visualized  Amniotic Fluid  AFI FV:      Within normal limits                              Largest Pocket(cm)                              5.7 ---------------------------------------------------------------------- Biometry  BPD:      47.6  mm     G. Age:  20w 3d  48  %    CI:        74.68   %    70 - 86                                                          FL/HC:      16.9   %    16.8 - 19.8  HC:      174.8  mm     G. Age:  20w 0d         23  %    HC/AC:      1.13        1.09 - 1.39  AC:      154.9  mm     G. Age:  20w 5d         52  %    FL/BPD:     62.0   %  FL:       29.5  mm     G. Age:  19w 1d          7  %    FL/AC:      19.0   %    20 - 24  HUM:        31  mm     G. Age:  20w 2d         47  %  CER:      20.9  mm     G. Age:  20w 0d         51  %  NFT:       5.1  mm  LV:          6  mm  CM:        4.1  mm  Est. FW:     324  gm    0 lb 11 oz      22  % ---------------------------------------------------------------------- OB History  Gravidity:    2         Term:   1  Living:       1 ---------------------------------------------------------------------- Gestational Age  LMP:           20w 3d        Date:  03/07/19                 EDD:   12/12/19  U/S Today:     20w 1d                                        EDD:   12/14/19  Best:          [name] 3d     Det. By:  LMP  (03/07/19)          EDD:   12/12/19 ---------------------------------------------------------------------- Anatomy  Cranium:               Appears normal         LVOT:                   Appears normal  Cavum:                 Appears normal  Aortic Arch:            Appears normal  Ventricles:            Appears normal         Ductal Arch:            Appears normal  Choroid Plexus:        Appears normal         Diaphragm:              Appears normal  Cerebellum:            Appears normal         Stomach:                Appears normal, left                                                                        sided  Posterior Fossa:        Appears normal         Abdomen:                Appears normal  Nuchal Fold:           Not applicable (>20    Abdominal Wall:         Appears nml (cord                         wks GA)                                        insert, abd wall)  Face:                  Appears normal         Cord Vessels:           Appears normal (3                         (orbits and profile)                           vessel cord)  Lips:                  Appears normal         Kidneys:                Appear normal  Palate:                Not well visualized    Bladder:                Appears normal  Thoracic:              Appears normal         Spine:                  Appears normal  Heart:                 Appears normal  Upper Extremities:      Appears normal                         (4CH, axis, and                         situs)  RVOT:                  Not well visualized    Lower Extremities:      Appears normal  Other:  Female gender Heels visualized. Nasal bone visualized. Open hands          visualized. Technically difficult due to fetal position and movement. ---------------------------------------------------------------------- Cervix Uterus Adnexa  Cervix  Length:           4.38  cm.  Normal appearance by transabdominal scan. ---------------------------------------------------------------------- Impression  G2 P1.  Patient is here for fetal anatomy scan.  She has  WPW syndrome and had cardiac ablation in 2008  (incomplete).  Patient has occasional palpitations and was  evaluated by her cardiologist 3 weeks ago.  She has Holter  monitor.  Echocardiography is scheduled to be performed  later this week.  We performed fetal anatomy scan. No makers of  aneuploidies or fetal structural defects are seen. Fetal  biometry is consistent with her previously-established dates.  Amniotic fluid is normal and good fetal activity is seen.  Patient understands the limitations of ultrasound in detecting  fetal anomalies.  MSAFP screening  showed low risk for open-neural tube  defects .  On cell-free fetal DNA screening, the risks of fetal  aneuploidies are not increased .  Obstetric history significant for a term vaginal delivery. ---------------------------------------------------------------------- Recommendations  -An appointment was made for her to return in 4 weeks for  completion of fetal anatomy (RVOT) ----------------------------------------------------------------------                  Noralee Spaceavi Shankar, MD Electronically Signed Final Report   07/28/2019 03:30 pm ----------------------------------------------------------------------   Assessment and Plan:  Pregnancy: G2P1001 at 5194w4d 1. Wolff-Parkinson-White (WPW) syndrome Followed by Cardiology. ECHO scheduled tomorrow. Next appointment 09/08/19.  2. History of gestational hypertension Was induced for this last pregnancy.  - aspirin EC 81 MG tablet; Take 1 tablet (81 mg total) by mouth daily. Take for prevention of preeclampsia in pregnancy  Dispense: 300 tablet; Refill: 2  3. [redacted] weeks gestation of pregnancy 4. Supervision of high risk pregnancy, antepartum See scan above, follow up scheduled. Preterm labor symptoms and general obstetric precautions including but not limited to vaginal bleeding, contractions, leaking of fluid and fetal movement were reviewed in detail with the patient. Please refer to After Visit Summary for other counseling recommendations.   Return in about 4 weeks (around 08/26/2019) for OFFICE OB Visit.  Future Appointments  Date Time Provider Department Center  07/30/2019  2:45 PM [facility]  08/27/2019  2:45 PM [facility]  08/27/2019  2:45 PM [facility]  09/08/2019  4:00 PM Crenshaw, Madolyn FriezeBrian S, MD [facility] None    Jaynie CollinsUgonna Makenly Larabee, MD

## 2019-07-30 ENCOUNTER — Other Ambulatory Visit: Payer: Self-pay

## 2019-07-30 ENCOUNTER — Ambulatory Visit (HOSPITAL_COMMUNITY): Payer: PRIVATE HEALTH INSURANCE | Attending: Cardiology

## 2019-07-30 ENCOUNTER — Encounter (HOSPITAL_COMMUNITY): Payer: Self-pay | Admitting: Radiology

## 2019-07-30 DIAGNOSIS — R002 Palpitations: Secondary | ICD-10-CM | POA: Diagnosis not present

## 2019-07-30 NOTE — Progress Notes (Unsigned)
Upon arrival for echocardiogram, patient experienced symptoms-she felt hot all over and finger and toes started to swell and felt really hot. She also felt her heart racing. Once she started to walk into the echo exam room she felt better. She feels fine at discharge for echocardiogram.

## 2019-08-26 ENCOUNTER — Other Ambulatory Visit: Payer: Self-pay

## 2019-08-26 ENCOUNTER — Encounter: Payer: Self-pay | Admitting: Obstetrics and Gynecology

## 2019-08-26 ENCOUNTER — Ambulatory Visit (INDEPENDENT_AMBULATORY_CARE_PROVIDER_SITE_OTHER): Payer: PRIVATE HEALTH INSURANCE | Admitting: Obstetrics and Gynecology

## 2019-08-26 VITALS — BP 118/68 | HR 88 | Wt 192.0 lb

## 2019-08-26 DIAGNOSIS — Z8759 Personal history of other complications of pregnancy, childbirth and the puerperium: Secondary | ICD-10-CM

## 2019-08-26 DIAGNOSIS — O99011 Anemia complicating pregnancy, first trimester: Secondary | ICD-10-CM

## 2019-08-26 DIAGNOSIS — O0992 Supervision of high risk pregnancy, unspecified, second trimester: Secondary | ICD-10-CM

## 2019-08-26 DIAGNOSIS — O26812 Pregnancy related exhaustion and fatigue, second trimester: Secondary | ICD-10-CM

## 2019-08-26 DIAGNOSIS — O0990 Supervision of high risk pregnancy, unspecified, unspecified trimester: Secondary | ICD-10-CM

## 2019-08-26 DIAGNOSIS — O99413 Diseases of the circulatory system complicating pregnancy, third trimester: Secondary | ICD-10-CM

## 2019-08-26 DIAGNOSIS — Z3A24 24 weeks gestation of pregnancy: Secondary | ICD-10-CM

## 2019-08-26 DIAGNOSIS — I456 Pre-excitation syndrome: Secondary | ICD-10-CM

## 2019-08-26 NOTE — Progress Notes (Signed)
   PRENATAL VISIT NOTE  Subjective:  Alicia Allen is a 34 y.o. G2P1001 at [redacted]w[redacted]d being seen today for ongoing prenatal care.  She is currently monitored for the following issues for this high-risk pregnancy and has Supervision of high risk pregnancy, antepartum; Wolff-Parkinson-White (WPW) syndrome; Beta thalassemia (HCC); Anemia affecting pregnancy in first trimester; and History of gestational hypertension on their problem list.  Patient reports fatigue and weakness for the last 2-3 weeks.  Contractions: Not present. Vag. Bleeding: None.  Movement: Present. Denies leaking of fluid.   The following portions of the patient's history were reviewed and updated as appropriate: allergies, current medications, past family history, past medical history, past social history, past surgical history and problem list.   Objective:   Vitals:   08/26/19 1453  BP: 118/68  Pulse: 88  Weight: 192 lb (87.1 kg)    Fetal Status: Fetal Heart Rate (bpm): 154   Movement: Present     General:  Alert, oriented and cooperative. Patient is in no acute distress.  Skin: Skin is warm and dry. No rash noted.   Cardiovascular: Normal heart rate noted  Respiratory: Normal respiratory effort, no problems with respiration noted  Abdomen: Soft, gravid, appropriate for gestational age.  Pain/Pressure: Absent     Pelvic: Cervical exam deferred        Extremities: Normal range of motion.  Edema: Trace  Mental Status: Normal mood and affect. Normal behavior. Normal judgment and thought content.   Assessment and Plan:  Pregnancy: G2P1001 at [redacted]w[redacted]d 1. Wolff-Parkinson-White (WPW) syndrome Normal echo Has cardiology f/u 8/4  2. Supervision of high risk pregnancy, antepartum  3. History of gestational hypertension BP normal today  4. Anemia affecting pregnancy in first trimester Fatigue x 1 weeks CBC and iron studies today  Preterm labor symptoms and general obstetric precautions including but not limited to vaginal  bleeding, contractions, leaking of fluid and fetal movement were reviewed in detail with the patient. Please refer to After Visit Summary for other counseling recommendations.   Return in about 3 weeks (around 09/16/2019) for high OB, in person, 2 hr GTT, 3rd trim labs.  Future Appointments  Date Time Provider Department Center  08/27/2019  2:45 PM [facility] NURSE [facility]  08/27/2019  2:45 PM [facility] US5 [facility]  09/08/2019  4:00 PM Crenshaw, Madolyn Frieze, MD [facility] None    Conan Bowens, MD

## 2019-08-27 ENCOUNTER — Ambulatory Visit: Payer: PRIVATE HEALTH INSURANCE | Admitting: *Deleted

## 2019-08-27 ENCOUNTER — Ambulatory Visit: Payer: PRIVATE HEALTH INSURANCE | Attending: Obstetrics and Gynecology

## 2019-08-27 DIAGNOSIS — Z3A24 24 weeks gestation of pregnancy: Secondary | ICD-10-CM

## 2019-08-27 DIAGNOSIS — E669 Obesity, unspecified: Secondary | ICD-10-CM

## 2019-08-27 DIAGNOSIS — O0990 Supervision of high risk pregnancy, unspecified, unspecified trimester: Secondary | ICD-10-CM | POA: Diagnosis present

## 2019-08-27 DIAGNOSIS — O99212 Obesity complicating pregnancy, second trimester: Secondary | ICD-10-CM | POA: Diagnosis not present

## 2019-08-27 DIAGNOSIS — O09292 Supervision of pregnancy with other poor reproductive or obstetric history, second trimester: Secondary | ICD-10-CM | POA: Diagnosis not present

## 2019-08-27 DIAGNOSIS — Z362 Encounter for other antenatal screening follow-up: Secondary | ICD-10-CM | POA: Diagnosis present

## 2019-08-27 DIAGNOSIS — Z87798 Personal history of other (corrected) congenital malformations: Secondary | ICD-10-CM

## 2019-08-27 DIAGNOSIS — Z363 Encounter for antenatal screening for malformations: Secondary | ICD-10-CM

## 2019-08-27 LAB — CBC
HCT: 31.6 % — ABNORMAL LOW (ref 35.0–45.0)
Hemoglobin: 9.5 g/dL — ABNORMAL LOW (ref 11.7–15.5)
MCH: 20.1 pg — ABNORMAL LOW (ref 27.0–33.0)
MCHC: 30.1 g/dL — ABNORMAL LOW (ref 32.0–36.0)
MCV: 66.8 fL — ABNORMAL LOW (ref 80.0–100.0)
MPV: 11.8 fL (ref 7.5–12.5)
Platelets: 292 10*3/uL (ref 140–400)
RBC: 4.73 10*6/uL (ref 3.80–5.10)
RDW: 15.6 % — ABNORMAL HIGH (ref 11.0–15.0)
WBC: 10.5 10*3/uL (ref 3.8–10.8)

## 2019-08-27 LAB — IRON,TIBC AND FERRITIN PANEL
Ferritin: 45 ng/mL (ref 16–154)
Iron: 64 ug/dL (ref 40–190)

## 2019-09-01 ENCOUNTER — Telehealth: Payer: Self-pay

## 2019-09-01 DIAGNOSIS — R718 Other abnormality of red blood cells: Secondary | ICD-10-CM

## 2019-09-01 NOTE — Telephone Encounter (Signed)
Pt is aware of Dr.Davis recommendation for iron infusion due to low MCV. Pt declines transfusion and requests referral to hematology. Referral sent per Dr.Davis.

## 2019-09-02 ENCOUNTER — Telehealth: Payer: Self-pay | Admitting: Hematology

## 2019-09-02 NOTE — Telephone Encounter (Signed)
Received a new hem referral from [facility] for low mcv. Alicia Allen has been cld and scheduled to see Dr. Candise Che on 8/16 at 1pm. Pt aware to arrive 15 minutes early. Letter mailed.

## 2019-09-02 NOTE — Progress Notes (Deleted)
HPI: Follow-up WPW. Patient has a history of ablation in 2008 by Dr. Sampson Goon at [facility].  She had a posterior septal location requiring lesions on both sides of the septum. Echocardiogram in 2011 showed normal LV function.  She had recurrent symptoms in 2012.  EPS was unremarkable.  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  Patient has a history of recurrent palpitations felt to be PVCs since her previous ablation.  Tolerated her first pregnancy in 2017.    Follow-up echocardiogram June 2021 showed normal LV function.  Monitor July 2021 showed sinus rhythm with rare PAC, rare PVC and 5 beats of paroxysmal atrial tachycardia.  Since last seen  Current Outpatient Medications  Medication Sig Dispense Refill  . albuterol (VENTOLIN HFA) 108 (90 Base) MCG/ACT inhaler Inhale 1 puff into the lungs every 6 (six) hours as needed for wheezing.  (Patient not taking: Reported on 08/26/2019)    . aspirin EC 81 MG tablet Take 1 tablet (81 mg total) by mouth daily. Take for prevention of preeclampsia in pregnancy (Patient not taking: Reported on 08/26/2019) 300 tablet 2  . folic acid (FOLVITE) 800 MCG tablet Take 400 mcg by mouth daily.    . Prenatal Vit-Fe Fumarate-FA (PRENATAL VITAMIN PO) Take by mouth.     No current facility-administered medications for this visit.     Past Medical History:  Diagnosis Date  . Asthma   . Beta thalassemia (HCC)   . Gestational hypertension   . History of Wolff-Parkinson-White syndrome   . Oral herpes simplex infection    Valtrex as needed. Never had genital lesions.    Past Surgical History:  Procedure Laterality Date  . BUNIONECTOMY    . CARDIAC SURGERY    . LAPAROSCOPY    . patent artero    . TONSILLECTOMY      Social History   Socioeconomic History  . Marital status: Married    Spouse name: Not on file  . Number of children: 1  . Years of education: Not on file  . Highest education level: Not on file    Occupational History  . Occupation: Print production planner  Tobacco Use  . Smoking status: Former Games developer  . Smokeless tobacco: Never Used  Vaping Use  . Vaping Use: Never used  Substance and Sexual Activity  . Alcohol use: Yes    Comment: Not since pregnant  . Drug use: Never  . Sexual activity: Yes    Birth control/protection: None  Other Topics Concern  . Not on file  Social History Narrative  . Not on file   Social Determinants of Health   Financial Resource Strain:   . Difficulty of Paying Living Expenses:   Food Insecurity:   . Worried About Programme researcher, broadcasting/film/video in the Last Year:   . Barista in the Last Year:   Transportation Needs:   . Freight forwarder (Medical):   Marland Kitchen Lack of Transportation (Non-Medical):   Physical Activity:   . Days of Exercise per Week:   . Minutes of Exercise per Session:   Stress:   . Feeling of Stress :   Social Connections:   . Frequency of Communication with Friends and Family:   . Frequency of Social Gatherings with Friends and Family:   . Attends Religious Services:   . Active Member of Clubs or Organizations:   . Attends Banker Meetings:   Marland Kitchen Marital Status:  Intimate Partner Violence:   . Fear of Current or Ex-Partner:   . Emotionally Abused:   Marland Kitchen Physically Abused:   . Sexually Abused:     Family History  Problem Relation Age of Onset  . Thalassemia Mother        Beta  . Multiple sclerosis Father   . Heart disease Father        Enlarged heart  . Breast cancer Maternal Grandmother   . Hypertension Maternal Grandmother   . Hypercholesterolemia Maternal Grandmother   . Heart attack Maternal Grandfather        multiple  . Diabetes Maternal Grandfather   . Breast cancer Paternal Grandmother   . Skin cancer Paternal Grandmother   . Huntington's disease Paternal Grandmother   . Fibromyalgia Paternal Grandmother   . Heart disease Paternal Grandfather     ROS: no fevers or chills, productive cough,  hemoptysis, dysphasia, odynophagia, melena, hematochezia, dysuria, hematuria, rash, seizure activity, orthopnea, PND, pedal edema, claudication. Remaining systems are negative.  Physical Exam: Well-developed well-nourished in no acute distress.  Skin is warm and dry.  HEENT is normal.  Neck is supple.  Chest is clear to auscultation with normal expansion.  Cardiovascular exam is regular rate and rhythm.  Abdominal exam nontender or distended. No masses palpated. Extremities show no edema. neuro grossly intact  ECG- personally reviewed  A/P  1 WPW-patient has had previous ablation in 2008.  She has had occasional palpitations since then but she did have a previous follow-up EP study which was unremarkable.  Most recent event monitor showed no significant arrhythmias.  LV function is normal.  2 palpitations-no significant rhythm disturbances noted on monitor.  We will continue to follow.  3 history of PDA closure-no residual flow based on recent echocardiogram.  4 intrauterine pregnancy-Per OB.  Olga Millers, MD

## 2019-09-08 ENCOUNTER — Ambulatory Visit: Payer: PRIVATE HEALTH INSURANCE | Admitting: Cardiology

## 2019-09-16 ENCOUNTER — Encounter: Payer: Self-pay | Admitting: Obstetrics and Gynecology

## 2019-09-16 ENCOUNTER — Other Ambulatory Visit: Payer: Self-pay

## 2019-09-16 ENCOUNTER — Ambulatory Visit (INDEPENDENT_AMBULATORY_CARE_PROVIDER_SITE_OTHER): Payer: PRIVATE HEALTH INSURANCE | Admitting: Obstetrics and Gynecology

## 2019-09-16 VITALS — BP 126/77 | HR 73 | Wt 196.2 lb

## 2019-09-16 DIAGNOSIS — O0990 Supervision of high risk pregnancy, unspecified, unspecified trimester: Secondary | ICD-10-CM

## 2019-09-16 DIAGNOSIS — I456 Pre-excitation syndrome: Secondary | ICD-10-CM

## 2019-09-16 DIAGNOSIS — Z8759 Personal history of other complications of pregnancy, childbirth and the puerperium: Secondary | ICD-10-CM

## 2019-09-16 DIAGNOSIS — O99011 Anemia complicating pregnancy, first trimester: Secondary | ICD-10-CM

## 2019-09-16 DIAGNOSIS — Z3A27 27 weeks gestation of pregnancy: Secondary | ICD-10-CM

## 2019-09-16 NOTE — Progress Notes (Signed)
Wants to defer Tdap at this time

## 2019-09-16 NOTE — Progress Notes (Signed)
° °  PRENATAL VISIT NOTE  Subjective:  Alicia Allen is a 34 y.o. G2P1001 at [redacted]w[redacted]d being seen today for ongoing prenatal care.  She is currently monitored for the following issues for this high-risk pregnancy and has Supervision of high risk pregnancy, antepartum; Wolff-Parkinson-White (WPW) syndrome; Beta thalassemia (HCC); Anemia affecting pregnancy in first trimester; and History of gestational hypertension on their problem list.  Patient reports had all day strong braxton hicks contractions/tightening in upper abdomen yesterday, only stopped when she laid in bed.  Contractions: Not present. Vag. Bleeding: None.  Movement: Present. Denies leaking of fluid.   The following portions of the patient's history were reviewed and updated as appropriate: allergies, current medications, past family history, past medical history, past social history, past surgical history and problem list.   Objective:   Vitals:   09/16/19 1621  BP: 126/77  Pulse: 73  Weight: 196 lb 3.2 oz (89 kg)    Fetal Status: Fetal Heart Rate (bpm): 142   Movement: Present     General:  Alert, oriented and cooperative. Patient is in no acute distress.  Skin: Skin is warm and dry. No rash noted.   Cardiovascular: Normal heart rate noted  Respiratory: Normal respiratory effort, no problems with respiration noted  Abdomen: Soft, gravid, appropriate for gestational age.  Pain/Pressure: Present     Pelvic: Cervical exam deferred        Extremities: Normal range of motion.  Edema: None  Mental Status: Normal mood and affect. Normal behavior. Normal judgment and thought content.   Assessment and Plan:  Pregnancy: G2P1001 at [redacted]w[redacted]d 1. [redacted] weeks gestation of pregnancy  2. Supervision of high risk pregnancy, antepartum Increase H2O intake  3. Wolff-Parkinson-White (WPW) syndrome Pt canceled appt with cardiology as she read the report and didn't want to pay the fee for visit to hear that everything was fine  4. Anemia affecting  pregnancy in first trimester Labs tomorrow  5. History of gestational hypertension  Preterm labor symptoms and general obstetric precautions including but not limited to vaginal bleeding, contractions, leaking of fluid and fetal movement were reviewed in detail with the patient. Please refer to After Visit Summary for other counseling recommendations.   Return in about 2 weeks (around 09/30/2019) for high OB, in person.  Future Appointments  Date Time Provider Department Center  09/17/2019  8:30 AM [facility] NURSE [facility]  09/20/2019  1:00 PM Johney Maine, MD [facility] None    Conan Bowens, MD

## 2019-09-17 ENCOUNTER — Other Ambulatory Visit (INDEPENDENT_AMBULATORY_CARE_PROVIDER_SITE_OTHER): Payer: PRIVATE HEALTH INSURANCE

## 2019-09-17 DIAGNOSIS — Z3480 Encounter for supervision of other normal pregnancy, unspecified trimester: Secondary | ICD-10-CM

## 2019-09-17 NOTE — Progress Notes (Signed)
Pt here for lab only 28 week.  Pt sent to lab.

## 2019-09-20 ENCOUNTER — Other Ambulatory Visit: Payer: Self-pay

## 2019-09-20 ENCOUNTER — Inpatient Hospital Stay: Payer: PRIVATE HEALTH INSURANCE

## 2019-09-20 ENCOUNTER — Encounter: Payer: Self-pay | Admitting: *Deleted

## 2019-09-20 ENCOUNTER — Inpatient Hospital Stay: Payer: PRIVATE HEALTH INSURANCE | Attending: Hematology | Admitting: Hematology

## 2019-09-20 VITALS — BP 115/72 | HR 77 | Temp 98.0°F | Resp 18 | Ht 63.0 in | Wt 196.6 lb

## 2019-09-20 DIAGNOSIS — Z3A28 28 weeks gestation of pregnancy: Secondary | ICD-10-CM

## 2019-09-20 DIAGNOSIS — Z87891 Personal history of nicotine dependence: Secondary | ICD-10-CM | POA: Diagnosis not present

## 2019-09-20 DIAGNOSIS — D561 Beta thalassemia: Secondary | ICD-10-CM | POA: Diagnosis not present

## 2019-09-20 DIAGNOSIS — D509 Iron deficiency anemia, unspecified: Secondary | ICD-10-CM

## 2019-09-20 DIAGNOSIS — D649 Anemia, unspecified: Secondary | ICD-10-CM

## 2019-09-20 LAB — CMP (CANCER CENTER ONLY)
ALT: 8 U/L (ref 0–44)
AST: 11 U/L — ABNORMAL LOW (ref 15–41)
Albumin: 3 g/dL — ABNORMAL LOW (ref 3.5–5.0)
Alkaline Phosphatase: 59 U/L (ref 38–126)
Anion gap: 8 (ref 5–15)
BUN: 8 mg/dL (ref 6–20)
CO2: 20 mmol/L — ABNORMAL LOW (ref 22–32)
Calcium: 9.2 mg/dL (ref 8.9–10.3)
Chloride: 108 mmol/L (ref 98–111)
Creatinine: 0.59 mg/dL (ref 0.44–1.00)
GFR, Est AFR Am: >60 mL/min (ref >60)
GFR, Estimated: >60 mL/min (ref >60)
Glucose, Bld: 109 mg/dL — ABNORMAL HIGH (ref 70–99)
Potassium: 3.4 mmol/L — ABNORMAL LOW (ref 3.5–5.1)
Sodium: 136 mmol/L (ref 135–145)
Total Bilirubin: 0.4 mg/dL (ref 0.3–1.2)
Total Protein: 6.4 g/dL — ABNORMAL LOW (ref 6.5–8.1)

## 2019-09-20 LAB — CBC
HCT: 31.3 % — ABNORMAL LOW (ref 35.0–45.0)
Hemoglobin: 9.9 g/dL — ABNORMAL LOW (ref 11.7–15.5)
MCH: 20.3 pg — ABNORMAL LOW (ref 27.0–33.0)
MCHC: 31.6 g/dL — ABNORMAL LOW (ref 32.0–36.0)
MCV: 64.3 fL — ABNORMAL LOW (ref 80.0–100.0)
MPV: 11.6 fL (ref 7.5–12.5)
Platelets: 264 10*3/uL (ref 140–400)
RBC: 4.87 10*6/uL (ref 3.80–5.10)
RDW: 15.7 % — ABNORMAL HIGH (ref 11.0–15.0)
WBC: 9.6 10*3/uL (ref 3.8–10.8)

## 2019-09-20 LAB — CBC WITH DIFFERENTIAL/PLATELET
Abs Immature Granulocytes: 0.08 10*3/uL — ABNORMAL HIGH (ref 0.00–0.07)
Basophils Absolute: 0.1 10*3/uL (ref 0.0–0.1)
Basophils Relative: 1 %
Eosinophils Absolute: 0.4 10*3/uL (ref 0.0–0.5)
Eosinophils Relative: 4 %
HCT: 31.8 % — ABNORMAL LOW (ref 36.0–46.0)
Hemoglobin: 9.9 g/dL — ABNORMAL LOW (ref 12.0–15.0)
Immature Granulocytes: 1 %
Lymphocytes Relative: 20 %
Lymphs Abs: 1.9 10*3/uL (ref 0.7–4.0)
MCH: 20.2 pg — ABNORMAL LOW (ref 26.0–34.0)
MCHC: 31.1 g/dL (ref 30.0–36.0)
MCV: 64.8 fL — ABNORMAL LOW (ref 80.0–100.0)
Monocytes Absolute: 0.5 10*3/uL (ref 0.1–1.0)
Monocytes Relative: 6 %
Neutro Abs: 6.6 10*3/uL (ref 1.7–7.7)
Neutrophils Relative %: 68 %
Platelets: 257 10*3/uL (ref 150–400)
RBC: 4.91 MIL/uL (ref 3.87–5.11)
RDW: 14.8 % (ref 11.5–15.5)
WBC: 9.5 10*3/uL (ref 4.0–10.5)
nRBC: 0 % (ref 0.0–0.2)

## 2019-09-20 LAB — 2HR GTT W 1 HR, CARPENTER, 75 G
Glucose, 1 Hr, Gest: 127 mg/dL (ref 65–179)
Glucose, 2 Hr, Gest: 118 mg/dL (ref 65–152)
Glucose, Fasting, Gest: 77 mg/dL (ref 65–91)

## 2019-09-20 LAB — RPR: RPR Ser Ql: NONREACTIVE

## 2019-09-20 LAB — HIV ANTIBODY (ROUTINE TESTING W REFLEX): HIV 1&2 Ab, 4th Generation: NONREACTIVE

## 2019-09-20 LAB — VITAMIN B12: Vitamin B-12: 194 pg/mL (ref 180–914)

## 2019-09-20 MED ORDER — POLYSACCHARIDE IRON COMPLEX 150 MG PO CAPS: 150.0000 mg | ORAL_CAPSULE | Freq: Every day | ORAL | 2 refills | Status: DC

## 2019-09-20 NOTE — Progress Notes (Signed)
HEMATOLOGY/ONCOLOGY CONSULTATION NOTE  Date of Service: 09/21/2019  Patient Care Team: Patient, No Pcp Per as PCP - General (General Practice)  CHIEF COMPLAINTS/PURPOSE OF CONSULTATION:  Low MCV/microcytic anemia  HISTORY OF PRESENTING ILLNESS:   Alicia Allen is a wonderful 34 y.o. female who has been referred to [place] by [facility] for evaluation and management of low MCV in pregnancy. The pt reports that she is doing well overall.   The pt reports that she is currently [redacted] weeks pregnant and has been feeling tired and nauseous. She has difficulty sleeping through the night due hip pain and general discomfort. She is having to drive to the office for work twice per week, which is 1.5 hours each way.   Pt was diagnosed with Beta thalassemia when she was about 34 years old. Pt was taken to the pediatrician due to looking jaundice and being extremely fatigued. She was placed on PO Iron which made her sick. Further w/o revealed her Beta thalassemia trait, which has been found to be hereditary, on her mother's side. Her baseline Hgb has been around 10 outside of pregnancy. She has never required a blood transfusion, although it has been a consideration.   Pt has had two cardiac ablations in the past. Around the second trimester she began having increased palpitations. Her fingers would get hot and red and she would feel very lightheaded. She had an ECHO and heart monitoring, which was unrevealing. She has been diagnosed with WPW.   She has a healthy, three year old son. She also experienced fatigue during this pregnancy and received two doses of IV Injectafer. Pt has Endometriosis and had heavy periods between pregnancies. She is currently taking a prenatal vitamin with 800 mcg Folic acid and an additional 800 mcg Folic acid daily. Pt has had a stomach upset when she has taken PO Ferrous Sulfate previously.    Most recent lab results (09/17/2019) of CBC is as follows: all values are  WNL except for Hgb at 9.9, HCT at 31.3, MCV at 64.3, MCH at 20.3, MCHC at 31.6, RDW at 15.7. 08/26/2019 Iron at 64, Ferritin at 45.   On review of systems, pt reports sleeplessness, fatigue, foot/ankle swelling, abdominal pain, hip pain and denies leg swelling and any other symptoms.   On PMHx the pt reports WPW, Endometriosis, Beta thalassemia, Cardiac Ablation. On Family Hx the pt reports maternal family history of Beta thalassemia.   MEDICAL HISTORY:  Past Medical History:  Diagnosis Date  . Asthma   . Beta thalassemia (HCC)   . Gestational hypertension   . History of Wolff-Parkinson-White syndrome   . Oral herpes simplex infection    Valtrex as needed. Never had genital lesions.    SURGICAL HISTORY: Past Surgical History:  Procedure Laterality Date  . BUNIONECTOMY    . CARDIAC SURGERY    . LAPAROSCOPY    . patent artero    . TONSILLECTOMY      SOCIAL HISTORY: Social History   Socioeconomic History  . Marital status: Married    Spouse name: Not on file  . Number of children: 1  . Years of education: Not on file  . Highest education level: Not on file  Occupational History  . Occupation: Print production planner  Tobacco Use  . Smoking status: Former Games developer  . Smokeless tobacco: Never Used  Vaping Use  . Vaping Use: Never used  Substance and Sexual Activity  . Alcohol use: Yes    Comment: Not since  pregnant  . Drug use: Never  . Sexual activity: Yes    Birth control/protection: None  Other Topics Concern  . Not on file  Social History Narrative  . Not on file   Social Determinants of Health   Financial Resource Strain:   . Difficulty of Paying Living Expenses:   Food Insecurity:   . Worried About Programme researcher, broadcasting/film/video in the Last Year:   . Barista in the Last Year:   Transportation Needs:   . Freight forwarder (Medical):   Marland Kitchen Lack of Transportation (Non-Medical):   Physical Activity:   . Days of Exercise per Week:   . Minutes of Exercise per  Session:   Stress:   . Feeling of Stress :   Social Connections:   . Frequency of Communication with Friends and Family:   . Frequency of Social Gatherings with Friends and Family:   . Attends Religious Services:   . Active Member of Clubs or Organizations:   . Attends Banker Meetings:   Marland Kitchen Marital Status:   Intimate Partner Violence:   . Fear of Current or Ex-Partner:   . Emotionally Abused:   Marland Kitchen Physically Abused:   . Sexually Abused:     FAMILY HISTORY: Family History  Problem Relation Age of Onset  . Thalassemia Mother        Beta  . Multiple sclerosis Father   . Heart disease Father        Enlarged heart  . Breast cancer Maternal Grandmother   . Hypertension Maternal Grandmother   . Hypercholesterolemia Maternal Grandmother   . Heart attack Maternal Grandfather        multiple  . Diabetes Maternal Grandfather   . Breast cancer Paternal Grandmother   . Skin cancer Paternal Grandmother   . Huntington's disease Paternal Grandmother   . Fibromyalgia Paternal Grandmother   . Heart disease Paternal Grandfather     ALLERGIES:  is allergic to metoclopramide.  MEDICATIONS:  Current Outpatient Medications  Medication Sig Dispense Refill  . albuterol (VENTOLIN HFA) 108 (90 Base) MCG/ACT inhaler Inhale 1 puff into the lungs every 6 (six) hours as needed for wheezing.  (Patient not taking: Reported on 08/26/2019)    . aspirin EC 81 MG tablet Take 1 tablet (81 mg total) by mouth daily. Take for prevention of preeclampsia in pregnancy (Patient not taking: Reported on 08/26/2019) 300 tablet 2  . folic acid (FOLVITE) 800 MCG tablet Take 400 mcg by mouth daily.    . iron polysaccharides (NIFEREX) 150 MG capsule Take 1 capsule (150 mg total) by mouth daily. 60 capsule 2  . Prenatal Vit-Fe Fumarate-FA (PRENATAL VITAMIN PO) Take by mouth.     No current facility-administered medications for this visit.    REVIEW OF SYSTEMS:    10 Point review of Systems was done is  negative except as noted above.  PHYSICAL EXAMINATION: ECOG PERFORMANCE STATUS: 1 - Symptomatic but completely ambulatory  . Vitals:   09/20/19 1319  BP: 115/72  Pulse: 77  Resp: 18  Temp: 98 F (36.7 C)  SpO2: 100%   Filed Weights   09/20/19 1319  Weight: 196 lb 9.6 oz (89.2 kg)   .Body mass index is 34.83 kg/m.  Exam was given in a chair   GENERAL:alert, in no acute distress and comfortable SKIN: no acute rashes, no significant lesions EYES: conjunctiva are pink and non-injected, sclera anicteric OROPHARYNX: MMM, no exudates, no oropharyngeal erythema or ulceration NECK: supple,  no JVD LYMPH:  no palpable lymphadenopathy in the cervical, axillary or inguinal regions LUNGS: clear to auscultation b/l with normal respiratory effort HEART: regular rate & rhythm ABDOMEN:  normoactive bowel sounds , non tender, not distended. Extremity: no pedal edema PSYCH: alert & oriented x 3 with fluent speech NEURO: no focal motor/sensory deficits  LABORATORY DATA:  I have reviewed the data as listed  . CBC Latest Ref Rng & Units 09/20/2019 09/20/2019 09/17/2019  WBC 4.0 - 10.5 K/uL 9.5 - 9.6  Hemoglobin 12.0 - 15.0 g/dL 1.6(X) - 9.9(L)  Hematocrit 34.0 - 46.6 % 31.8(L) 30.9(L) 31.3(L)  Platelets 150 - 400 K/uL 257 - 264    . CMP Latest Ref Rng & Units 09/20/2019 05/14/2019  Glucose 70 - 99 mg/dL 096(E) 45(W)  BUN 6 - 20 mg/dL 8 11  Creatinine 0.98 - 1.00 mg/dL 1.19 1.47(W)  Sodium 295 - 145 mmol/L 136 136  Potassium 3.5 - 5.1 mmol/L 3.4(L) 3.7  Chloride 98 - 111 mmol/L 108 103  CO2 22 - 32 mmol/L 20(L) 19(L)  Calcium 8.9 - 10.3 mg/dL 9.2 9.2  Total Protein 6.5 - 8.1 g/dL 6.4(L) 7.0  Total Bilirubin 0.3 - 1.2 mg/dL 0.4 0.5  Alkaline Phos 38 - 126 U/L 59 -  AST 15 - 41 U/L 11(L) 13  ALT 0 - 44 U/L 8 11     RADIOGRAPHIC STUDIES: I have personally reviewed the radiological images as listed and agreed with the findings in the report. LONG TERM MONITOR (3-14 DAYS)  Result  Date: 08/27/2019 Sinus with rare PAC, 5 beats PAT and rare PVC.  [place] MFM OB FOLLOW UP  Result Date: 08/27/2019 ----------------------------------------------------------------------  OBSTETRICS REPORT                       (Signed Final 08/27/2019 04:41 pm) ---------------------------------------------------------------------- Patient Info  ID #:       [id number]                          D.O.B.:  05/10/1985 (33 yrs)  Name:       Alicia Allen                      Visit Date: 08/27/2019 04:06 pm ---------------------------------------------------------------------- Performed By  Attending:        Ma Rings MD         Ref. Address:     [address]                                                             [place], [place]  [address]  Performed By:     Truitt Leep       [facility]  Referred By:      Tereso Newcomer MD ---------------------------------------------------------------------- Orders  #  Description                           Code        Ordered By  1  [place] MFM OB FOLLOW UP                   52841.32    Noralee Space ----------------------------------------------------------------------  #  Order #                     Accession #                Episode #  1  [id number]                   [id number]                 [id number] ---------------------------------------------------------------------- Indications  Obesity complicating pregnancy, second         O99.212  trimester  Poor obstetric history: Previous               O09.299  preeclampsia / eclampsia/gestational HTN  History of congenital or genetic condition     Z87.798  (WPW, S/P  ablation 2008)  Encounter for antenatal screening for          Z36.3  malformations  Beta thalassemia  LR NIPS, nml AFP  [redacted] weeks gestation of pregnancy                Z3A.24 ---------------------------------------------------------------------- Vital Signs                                                 Height:        5'3" ---------------------------------------------------------------------- Fetal Evaluation  Num Of Fetuses:         1  Cardiac Activity:       Observed  Presentation:           Variable  Placenta:               Anterior  P. Cord Insertion:      Visualized, central  Amniotic Fluid  AFI FV:      Within normal limits  AFI Sum(cm)     %Tile       Largest Pocket(cm)  19.4            79          5.5  RUQ(cm)       RLQ(cm)       LUQ(cm)        LLQ(cm)  4.8           3.7           5.4            5.5 ---------------------------------------------------------------------- Biometry  BPD:      61.2  mm     G. Age:  24w 6d         48  %    CI:        78.04   %    70 - 86                                                          FL/HC:      19.4   %    18.7 - 20.3  HC:      219.2  mm     G. Age:  24w 0d         10  %    HC/AC:      1.03        1.04 - 1.22  AC:       212   mm     G. Age:  25w 5d         73  %    FL/BPD:     69.4   %    71 - 87  FL:       42.5  mm     G. Age:  23w 6d         15  %    FL/AC:      20.0   %    20 - 24  Est. FW:     741  gm    1 lb 10 oz      46  % ---------------------------------------------------------------------- OB History  Gravidity:    2         Term:   1  Living:       1 ---------------------------------------------------------------------- Gestational Age  LMP:           24w 5d        Date:  03/07/19                 EDD:   12/12/19  U/S Today:     24w 4d                                        EDD:   12/13/19  Best:          Darien Ramus 5d  Det. By:  LMP  (03/07/19)          EDD:   12/12/19 ---------------------------------------------------------------------- Anatomy  Cranium:                Appears normal         Aortic Arch:            Appears normal  Cavum:                 Appears normal         Ductal Arch:            Appears normal  Ventricles:            Appears normal         Diaphragm:              Appears normal  Choroid Plexus:        Appears normal         Stomach:                Appears normal, left                                                                        sided  Cerebellum:            Appears normal         Abdomen:                Previously seen  Posterior Fossa:       Appears normal         Abdominal Wall:         Previously seen  Face:                  Appears normal         Cord Vessels:           Previously seen                         (orbits and profile)  Lips:                  Appears normal         Kidneys:                Appear normal  Palate:                Appears normal         Bladder:                Appears normal  Thoracic:              Appears normal         Spine:                  Previously seen  Heart:                 Appears normal         Upper Extremities:      Previously seen                         (4CH, axis, and  situs)  RVOT:                  Appears normal         Lower Extremities:      Previously seen  LVOT:                  Appears normal ---------------------------------------------------------------------- Cervix Uterus Adnexa  Cervix  Not visualized (advanced GA >24wks) ---------------------------------------------------------------------- Comments  This patient was seen for a follow up exam as the fetal  cardiac views were unable to be fully visualized during her  prior exam.  She denies any problems since her last exam.  She was informed that the fetal growth and amniotic fluid  level appears appropriate for her gestational age.  The views of the fetal heart were visualized today.  There  were no obvious anomalies suspected.  The limitations of  ultrasound in the detection of all anomalies was discussed.   Follow-up as indicated. 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 Lab Results  Component Value Date   IRON 62 09/20/2019   TIBC 428 09/20/2019   IRONPCTSAT 14 (L) 09/20/2019   (Iron and TIBC)  Lab Results  Component Value Date   FERRITIN 39 09/20/2019    PLAN: -Discussed patient's most recent labs from 09/17/2019, WBC & PLT are nml, mild anemia -Discussed 08/26/2019 Iron & Ferritin are WNL -Advised pt that some anemia is expected in pregnancy due to dilution. -Advised pt that some individuals with Beta thalassemia can become iron overloaded due to excessive iron supplementation or Ineffective Erythropoiesis, but that her latest Ferritin levels do not reflect this.  -Goal Ferritin 50-100 to limit blood transfusion requirement. -Will consider giving IV Iron if pt can not tolerate PO Iron or if Ferritin levels drop.  -Advised pt that IV Injectafer will require less infusions and has a lower risk of an allergic reaction than IV Venofer. -Advised pt that IV Venofer has been well-studied in pregnancy.  -Will begin pt on Iron Polysaccharide 1 pill a day. If tolerating well can go to 1 pill BID in 1 week. -Will get labs today  -Will repeat labs in 1 month   FOLLOW UP: Labs today RTC with Dr Candise Che with labs in 4 weeks   All of the patients questions were answered with apparent satisfaction. The patient knows to call the clinic with any problems, questions or concerns.  I spent 35 mins counseling the patient face to face. The total time spent in the appointment was 45 minutes and more than 50% was on counseling  and direct patient cares.    Wyvonnia Lora MD MS AAHIVMS [facility] Hematology/Oncology Physician [facility]  (Office):       [phone] (Work cell):  [phone] (Fax):           [phone]  09/21/2019 5:18 PM  I, Carollee Herter, am acting as a scribe for Dr. Wyvonnia Lora.   .I have reviewed the above documentation for accuracy and completeness, and I agree with the above. Johney Maine MD

## 2019-09-21 ENCOUNTER — Telehealth: Payer: Self-pay | Admitting: *Deleted

## 2019-09-21 LAB — IRON AND TIBC
Iron: 62 ug/dL (ref 41–142)
Saturation Ratios: 14 % — ABNORMAL LOW (ref 21–57)
TIBC: 428 ug/dL (ref 236–444)
UIBC: 366 ug/dL (ref 120–384)

## 2019-09-21 LAB — FOLATE RBC
Folate, Hemolysate: 503 ng/mL
Folate, RBC: 1628 ng/mL (ref >498)
Hematocrit: 30.9 % — ABNORMAL LOW (ref 34.0–46.6)

## 2019-09-21 LAB — FERRITIN: Ferritin: 39 ng/mL (ref 11–307)

## 2019-09-21 NOTE — Telephone Encounter (Signed)
Patient requested letter stating that Dr. Candise Che recommended she work from home 5 days a week and not commute to work 2 days a week (currently works from home 3 days a week) due to her condition. Letter written, signed by Dr. Candise Che and mailed to home address.

## 2019-09-22 ENCOUNTER — Telehealth: Payer: Self-pay | Admitting: *Deleted

## 2019-09-22 NOTE — Telephone Encounter (Signed)
Pt called stating that her son has unconfirmed hand foot mouth disease.  Pt states that his pediatrician's office is pretty sure that is what he has.  Pt is [redacted] weeks pregnant and was concerned about her unborn child.  I explained that there wasn't anything to do but to make sure that she wash her hands good after contact with the child.  I explained that if she were near delivery and she contracted the virus she would have a higher chance of passing it to the newborn.  Pt states she just wanted to make sure.

## 2019-09-30 ENCOUNTER — Ambulatory Visit (INDEPENDENT_AMBULATORY_CARE_PROVIDER_SITE_OTHER): Payer: PRIVATE HEALTH INSURANCE | Admitting: Obstetrics and Gynecology

## 2019-09-30 ENCOUNTER — Other Ambulatory Visit: Payer: Self-pay

## 2019-09-30 ENCOUNTER — Encounter: Payer: Self-pay | Admitting: Obstetrics and Gynecology

## 2019-09-30 VITALS — BP 119/76 | HR 79 | Wt 197.0 lb

## 2019-09-30 DIAGNOSIS — I456 Pre-excitation syndrome: Secondary | ICD-10-CM

## 2019-09-30 DIAGNOSIS — Z3A29 29 weeks gestation of pregnancy: Secondary | ICD-10-CM

## 2019-09-30 DIAGNOSIS — Z8759 Personal history of other complications of pregnancy, childbirth and the puerperium: Secondary | ICD-10-CM

## 2019-09-30 DIAGNOSIS — O0990 Supervision of high risk pregnancy, unspecified, unspecified trimester: Secondary | ICD-10-CM

## 2019-09-30 NOTE — Progress Notes (Addendum)
   PRENATAL VISIT NOTE  Subjective:  Alicia Allen is a 34 y.o. G2P1001 at [redacted]w[redacted]d being seen today for ongoing prenatal care.  She is currently monitored for the following issues for this high-risk pregnancy and has Supervision of high risk pregnancy, antepartum; Wolff-Parkinson-White (WPW) syndrome; Beta thalassemia (HCC); Anemia affecting pregnancy in first trimester; and History of gestational hypertension on their problem list.  Patient reports no complaints.  Contractions: Not present. Vag. Bleeding: None.  Movement: Present. Denies leaking of fluid.   The following portions of the patient's history were reviewed and updated as appropriate: allergies, current medications, past family history, past medical history, past social history, past surgical history and problem list.   Objective:   Vitals:   09/30/19 1328  BP: 119/76  Pulse: 79  Weight: 197 lb (89.4 kg)    Fetal Status: Fetal Heart Rate (bpm): 147 Fundal Height: 30 cm Movement: Present     General:  Alert, oriented and cooperative. Patient is in no acute distress.  Skin: Skin is warm and dry. No rash noted.   Cardiovascular: Normal heart rate noted  Respiratory: Normal respiratory effort, no problems with respiration noted  Abdomen: Soft, gravid, appropriate for gestational age.  Pain/Pressure: Present     Pelvic: Cervical exam deferred        Extremities: Normal range of motion.  Edema: None  Mental Status: Normal mood and affect. Normal behavior. Normal judgment and thought content.   Assessment and Plan:  Pregnancy: G2P1001 at [redacted]w[redacted]d 1. [redacted] weeks gestation of pregnancy   2. Supervision of high risk pregnancy, antepartum Patient is doing well without complaints Continue iron supplement per hematologist. Plan to repeat labs in 4 weeks Patient is undecided on contraception and is considering POP  3. History of gestational hypertension Normotensive Continue ASA  4. Wolff-Parkinson-White (WPW) syndrome Patient did not  follow up with cardiology Currently asymptomatic  Preterm labor symptoms and general obstetric precautions including but not limited to vaginal bleeding, contractions, leaking of fluid and fetal movement were reviewed in detail with the patient. Please refer to After Visit Summary for other counseling recommendations.   Return in about 2 weeks (around 10/14/2019) for in person, ROB.  Future Appointments  Date Time Provider Department Center  10/18/2019  9:00 AM [facility] None  10/18/2019  9:40 AM Kale, Corene Cornea, MD [facility] None    Catalina Antigua, MD

## 2019-10-14 ENCOUNTER — Other Ambulatory Visit (HOSPITAL_COMMUNITY)
Admission: RE | Admit: 2019-10-14 | Discharge: 2019-10-14 | Disposition: A | Discharge status: Home / Self Care | Payer: PRIVATE HEALTH INSURANCE | Source: Ambulatory Visit | Attending: Obstetrics and Gynecology | Admitting: Obstetrics and Gynecology

## 2019-10-14 ENCOUNTER — Other Ambulatory Visit: Payer: Self-pay

## 2019-10-14 ENCOUNTER — Encounter: Payer: Self-pay | Admitting: Obstetrics and Gynecology

## 2019-10-14 ENCOUNTER — Ambulatory Visit (INDEPENDENT_AMBULATORY_CARE_PROVIDER_SITE_OTHER): Payer: PRIVATE HEALTH INSURANCE | Admitting: Obstetrics and Gynecology

## 2019-10-14 VITALS — BP 141/74 | HR 83 | Wt 199.0 lb

## 2019-10-14 DIAGNOSIS — O0993 Supervision of high risk pregnancy, unspecified, third trimester: Secondary | ICD-10-CM

## 2019-10-14 DIAGNOSIS — O0990 Supervision of high risk pregnancy, unspecified, unspecified trimester: Secondary | ICD-10-CM

## 2019-10-14 DIAGNOSIS — O26899 Other specified pregnancy related conditions, unspecified trimester: Secondary | ICD-10-CM

## 2019-10-14 DIAGNOSIS — Z8759 Personal history of other complications of pregnancy, childbirth and the puerperium: Secondary | ICD-10-CM

## 2019-10-14 DIAGNOSIS — Z3A31 31 weeks gestation of pregnancy: Secondary | ICD-10-CM

## 2019-10-14 DIAGNOSIS — O99013 Anemia complicating pregnancy, third trimester: Secondary | ICD-10-CM

## 2019-10-14 DIAGNOSIS — R030 Elevated blood-pressure reading, without diagnosis of hypertension: Secondary | ICD-10-CM

## 2019-10-14 DIAGNOSIS — R109 Unspecified abdominal pain: Secondary | ICD-10-CM

## 2019-10-14 DIAGNOSIS — I456 Pre-excitation syndrome: Secondary | ICD-10-CM

## 2019-10-14 DIAGNOSIS — O26893 Other specified pregnancy related conditions, third trimester: Secondary | ICD-10-CM | POA: Insufficient documentation

## 2019-10-14 DIAGNOSIS — O99011 Anemia complicating pregnancy, first trimester: Secondary | ICD-10-CM

## 2019-10-14 LAB — POCT URINALYSIS DIPSTICK OB
Bilirubin, UA: NEGATIVE
Blood, UA: NEGATIVE
Glucose, UA: NEGATIVE
Ketones, UA: NEGATIVE
Leukocytes, UA: NEGATIVE
Nitrite, UA: NEGATIVE
POC,PROTEIN,UA: NEGATIVE
Spec Grav, UA: 1.01 (ref 1.010–1.025)
Urobilinogen, UA: 0.2 E.U./dL
pH, UA: 5 (ref 5.0–8.0)

## 2019-10-14 NOTE — Progress Notes (Signed)
   PRENATAL VISIT NOTE  Subjective:  Alicia Allen is a 34 y.o. G2P1001 at [redacted]w[redacted]d being seen today for ongoing prenatal care.  She is currently monitored for the following issues for this high-risk pregnancy and has Supervision of high risk pregnancy, antepartum; Wolff-Parkinson-White (WPW) syndrome; Beta thalassemia (HCC); Anemia affecting pregnancy in first trimester; and History of gestational hypertension on their problem list.  Patient reports period cramping since this am, different than usual. Patient reports baby is moving slightly less than usual but still quite active, particularly after she eat/sdrinks. Contractions: Irritability. Vag. Bleeding: None.  Movement: Present. Denies leaking of fluid.   The following portions of the patient's history were reviewed and updated as appropriate: allergies, current medications, past family history, past medical history, past social history, past surgical history and problem list.   Objective:   Vitals:   10/14/19 1424  BP: (!) 141/74  Pulse: 83  Weight: 199 lb (90.3 kg)    Fetal Status: Fetal Heart Rate (bpm): 140   Movement: Present     General:  Alert, oriented and cooperative. Patient is in no acute distress.  Skin: Skin is warm and dry. No rash noted.   Cardiovascular: Normal heart rate noted  Respiratory: Normal respiratory effort, no problems with respiration noted  Abdomen: Soft, gravid, appropriate for gestational age.  Pain/Pressure: Present     Pelvic: Cervical exam performed in the presence of a chaperone Dilation: Closed Effacement (%): 0    Extremities: Normal range of motion.  Edema: None  Mental Status: Normal mood and affect. Normal behavior. Normal judgment and thought content.   Assessment and Plan:  Pregnancy: G2P1001 at [redacted]w[redacted]d  1. [redacted] weeks gestation of pregnancy - POC Urinalysis Dipstick OB  2. Wolff-Parkinson-White (WPW) syndrome Asymptomatic, s/p ablation 2008  3. Supervision of high risk pregnancy,  antepartum Encouraged her to present to MAU if significantly decreased fetal movement  4. Anemia affecting pregnancy in first trimester Cont iron supplement per Heme F/u labs next week  5. History of gestational hypertension  6. Elevated blood pressure reading in office without diagnosis of hypertension Reviewed if gHTN, will be delivered @ 37 weeks - Comprehensive metabolic panel - CBC - Protein / creatinine ratio, urine  7. Cramping affecting pregnancy, antepartum - closed cervix, likely dehydration, encouraged PO hydration - Cervicovaginal ancillary only - Urine Culture  Preterm labor symptoms and general obstetric precautions including but not limited to vaginal bleeding, contractions, leaking of fluid and fetal movement were reviewed in detail with the patient. Please refer to After Visit Summary for other counseling recommendations.   Return in about 2 weeks (around 10/28/2019) for high OB, in person.  Future Appointments  Date Time Provider Department Center  10/18/2019  9:00 AM [facility] None  10/18/2019  9:40 AM Johney Maine, MD [facility] None    Conan Bowens, MD

## 2019-10-14 NOTE — Progress Notes (Signed)
Pt c/o back pain and feels lightheaded

## 2019-10-15 LAB — CERVICOVAGINAL ANCILLARY ONLY
Bacterial Vaginitis (gardnerella): NEGATIVE
Candida Glabrata: NEGATIVE
Candida Vaginitis: NEGATIVE
Chlamydia: NEGATIVE
Comment: NEGATIVE
Comment: NEGATIVE
Comment: NEGATIVE
Comment: NEGATIVE
Comment: NEGATIVE
Comment: NORMAL
Neisseria Gonorrhea: NEGATIVE
Trichomonas: NEGATIVE

## 2019-10-15 LAB — COMPREHENSIVE METABOLIC PANEL
AG Ratio: 1.5 (calc) (ref 1.0–2.5)
ALT: 7 U/L (ref 6–29)
AST: 12 U/L (ref 10–30)
Albumin: 3.4 g/dL — ABNORMAL LOW (ref 3.6–5.1)
Alkaline phosphatase (APISO): 65 U/L (ref 31–125)
BUN/Creatinine Ratio: 15 (calc) (ref 6–22)
BUN: 7 mg/dL (ref 7–25)
CO2: 21 mmol/L (ref 20–32)
Calcium: 8.6 mg/dL (ref 8.6–10.2)
Chloride: 106 mmol/L (ref 98–110)
Creat: 0.48 mg/dL — ABNORMAL LOW (ref 0.50–1.10)
Globulin: 2.3 g/dL (calc) (ref 1.9–3.7)
Glucose, Bld: 99 mg/dL (ref 65–99)
Potassium: 3.3 mmol/L — ABNORMAL LOW (ref 3.5–5.3)
Sodium: 135 mmol/L (ref 135–146)
Total Bilirubin: 0.4 mg/dL (ref 0.2–1.2)
Total Protein: 5.7 g/dL — ABNORMAL LOW (ref 6.1–8.1)

## 2019-10-15 LAB — CBC
HCT: 33.3 % — ABNORMAL LOW (ref 35.0–45.0)
Hemoglobin: 9.8 g/dL — ABNORMAL LOW (ref 11.7–15.5)
MCH: 20 pg — ABNORMAL LOW (ref 27.0–33.0)
MCHC: 29.4 g/dL — ABNORMAL LOW (ref 32.0–36.0)
MCV: 68.1 fL — ABNORMAL LOW (ref 80.0–100.0)
MPV: 11.6 fL (ref 7.5–12.5)
Platelets: 267 10*3/uL (ref 140–400)
RBC: 4.89 10*6/uL (ref 3.80–5.10)
RDW: 15.6 % — ABNORMAL HIGH (ref 11.0–15.0)
WBC: 9.8 10*3/uL (ref 3.8–10.8)

## 2019-10-16 LAB — URINE CULTURE
MICRO NUMBER:: 10932922
Result:: NO GROWTH
SPECIMEN QUALITY:: ADEQUATE

## 2019-10-16 LAB — PROTEIN / CREATININE RATIO, URINE
Creatinine, Urine: 23 mg/dL (ref 20–275)
Total Protein, Urine: <4 mg/dL — ABNORMAL LOW (ref 5–24)

## 2019-10-18 ENCOUNTER — Inpatient Hospital Stay: Payer: PRIVATE HEALTH INSURANCE | Attending: Hematology

## 2019-10-18 ENCOUNTER — Inpatient Hospital Stay (HOSPITAL_BASED_OUTPATIENT_CLINIC_OR_DEPARTMENT_OTHER): Payer: PRIVATE HEALTH INSURANCE | Admitting: Hematology

## 2019-10-18 ENCOUNTER — Other Ambulatory Visit: Payer: Self-pay

## 2019-10-18 VITALS — BP 116/71 | HR 70 | Temp 97.2°F | Resp 18 | Ht 63.0 in | Wt 198.5 lb

## 2019-10-18 DIAGNOSIS — D561 Beta thalassemia: Secondary | ICD-10-CM | POA: Diagnosis not present

## 2019-10-18 DIAGNOSIS — O99019 Anemia complicating pregnancy, unspecified trimester: Secondary | ICD-10-CM | POA: Insufficient documentation

## 2019-10-18 DIAGNOSIS — D509 Iron deficiency anemia, unspecified: Secondary | ICD-10-CM | POA: Insufficient documentation

## 2019-10-18 DIAGNOSIS — D649 Anemia, unspecified: Secondary | ICD-10-CM | POA: Diagnosis not present

## 2019-10-18 DIAGNOSIS — Z3A28 28 weeks gestation of pregnancy: Secondary | ICD-10-CM | POA: Diagnosis not present

## 2019-10-18 DIAGNOSIS — R718 Other abnormality of red blood cells: Secondary | ICD-10-CM | POA: Diagnosis present

## 2019-10-18 LAB — CMP (CANCER CENTER ONLY)
ALT: 8 U/L (ref 0–44)
AST: 14 U/L — ABNORMAL LOW (ref 15–41)
Albumin: 2.9 g/dL — ABNORMAL LOW (ref 3.5–5.0)
Alkaline Phosphatase: 71 U/L (ref 38–126)
Anion gap: 7 (ref 5–15)
BUN: 6 mg/dL (ref 6–20)
CO2: 19 mmol/L — ABNORMAL LOW (ref 22–32)
Calcium: 8.7 mg/dL — ABNORMAL LOW (ref 8.9–10.3)
Chloride: 108 mmol/L (ref 98–111)
Creatinine: 0.57 mg/dL (ref 0.44–1.00)
GFR, Est AFR Am: >60 mL/min (ref >60)
GFR, Estimated: >60 mL/min (ref >60)
Glucose, Bld: 85 mg/dL (ref 70–99)
Potassium: 3.6 mmol/L (ref 3.5–5.1)
Sodium: 134 mmol/L — ABNORMAL LOW (ref 135–145)
Total Bilirubin: 0.5 mg/dL (ref 0.3–1.2)
Total Protein: 6.2 g/dL — ABNORMAL LOW (ref 6.5–8.1)

## 2019-10-18 LAB — CBC WITH DIFFERENTIAL/PLATELET
Abs Immature Granulocytes: 0.08 10*3/uL — ABNORMAL HIGH (ref 0.00–0.07)
Basophils Absolute: 0.1 10*3/uL (ref 0.0–0.1)
Basophils Relative: 1 %
Eosinophils Absolute: 0.3 10*3/uL (ref 0.0–0.5)
Eosinophils Relative: 3 %
HCT: 31 % — ABNORMAL LOW (ref 36.0–46.0)
Hemoglobin: 9.8 g/dL — ABNORMAL LOW (ref 12.0–15.0)
Immature Granulocytes: 1 %
Lymphocytes Relative: 17 %
Lymphs Abs: 1.6 10*3/uL (ref 0.7–4.0)
MCH: 20.4 pg — ABNORMAL LOW (ref 26.0–34.0)
MCHC: 31.6 g/dL (ref 30.0–36.0)
MCV: 64.4 fL — ABNORMAL LOW (ref 80.0–100.0)
Monocytes Absolute: 0.5 10*3/uL (ref 0.1–1.0)
Monocytes Relative: 5 %
Neutro Abs: 6.6 10*3/uL (ref 1.7–7.7)
Neutrophils Relative %: 73 %
Platelets: 259 10*3/uL (ref 150–400)
RBC: 4.81 MIL/uL (ref 3.87–5.11)
RDW: 14.8 % (ref 11.5–15.5)
WBC: 9.1 10*3/uL (ref 4.0–10.5)
nRBC: 0 % (ref 0.0–0.2)

## 2019-10-18 LAB — FERRITIN: Ferritin: 28 ng/mL (ref 11–307)

## 2019-10-18 LAB — IRON AND TIBC
Iron: 106 ug/dL (ref 41–142)
Saturation Ratios: 24 % (ref 21–57)
TIBC: 438 ug/dL (ref 236–444)
UIBC: 332 ug/dL (ref 120–384)

## 2019-10-18 NOTE — Progress Notes (Signed)
HEMATOLOGY/ONCOLOGY CONSULTATION NOTE  Date of Service: 10/18/2019  Patient Care Team: Patient, No Pcp Per as PCP - General (General Practice)  CHIEF COMPLAINTS/PURPOSE OF CONSULTATION:  Low MCV/microcytic anemia  HISTORY OF PRESENTING ILLNESS:   Alicia Allen is a wonderful 34 y.o. female who has been referred to [place] by Center for [facility] for evaluation and management of low MCV in pregnancy. The pt reports that she is doing well overall.   The pt reports that she is currently [redacted] weeks pregnant and has been feeling tired and nauseous. She has difficulty sleeping through the night due hip pain and general discomfort. She is having to drive to the office for work twice per week, which is 1.5 hours each way.   Pt was diagnosed with Beta thalassemia when she was about 34 years old. Pt was taken to the pediatrician due to looking jaundice and being extremely fatigued. She was placed on PO Iron which made her sick. Further w/o revealed her Beta thalassemia trait, which has been found to be hereditary, on her mother's side. Her baseline Hgb has been around 10 outside of pregnancy. She has never required a blood transfusion, although it has been a consideration.   Pt has had two cardiac ablations in the past. Around the second trimester she began having increased palpitations. Her fingers would get hot and red and she would feel very lightheaded. She had an ECHO and heart monitoring, which was unrevealing. She has been diagnosed with WPW.   She has a healthy, three year old son. She also experienced fatigue during this pregnancy and received two doses of IV Injectafer. Pt has Endometriosis and had heavy periods between pregnancies. She is currently taking a prenatal vitamin with 800 mcg Folic acid and an additional 800 mcg Folic acid daily. Pt has had a stomach upset when she has taken PO Ferrous Sulfate previously.    Most recent lab results (09/17/2019) of CBC is as follows: all values are  WNL except for Hgb at 9.9, HCT at 31.3, MCV at 64.3, MCH at 20.3, MCHC at 31.6, RDW at 15.7. 08/26/2019 Iron at 64, Ferritin at 45.   On review of systems, pt reports sleeplessness, fatigue, foot/ankle swelling, abdominal pain, hip pain and denies leg swelling and any other symptoms.   On PMHx the pt reports WPW, Endometriosis, Beta thalassemia, Cardiac Ablation. On Family Hx the pt reports maternal family history of Beta thalassemia.   INTERVAL HISTORY:  Alicia Allen is a wonderful 34 y.o. female who is here for evaluation and management of low MCV in pregnancy. The patient's last visit with [place] was on 09/20/2019. The pt reports that she is doing well overall.  The pt reports that she has been fatigued. Pt was taking PO Iron with food every other day, but discontinued due to constipation, nausea, and loss of appetite. These symptoms resolved when she discontinued PO Iron. She has continued taking her prenatal vitamins.   Pt notes that she has daily episodes of low blood pressure that cause her to feel light headed. She spoke with her OBGYN, who believes that this is caused by blood moving to her stomach after meals. Pt reports that her last blood pressure reading at the OBGYN was elevated. If her blood pressure is high at her next check-in they will induce labor at 37 weeks.   Lab results today (10/18/19) of CBC w/diff and CMP is as follows: all values are WNL except for Hgb at 9.8, HCT at 31.0,  MCV at 64.4, MCH at 20.4, Abs Immature Granulocytes at 0.08K, Sodium at 134, CO2 at 19, Calcium at 8.7, Total Protein at 6.2, Albumin at 2.9, AST at 14. 10/18/2019 Ferritin at 28 10/18/2019 Iron Panel is as follows: all values are WNL  On review of systems, pt reports fatigue, lightheadedness and denies any other symptoms.    MEDICAL HISTORY:  Past Medical History:  Diagnosis Date  . Asthma   . Beta thalassemia (HCC)   . Gestational hypertension   . History of Wolff-Parkinson-White syndrome   .  Oral herpes simplex infection    Valtrex as needed. Never had genital lesions.    SURGICAL HISTORY: Past Surgical History:  Procedure Laterality Date  . BUNIONECTOMY    . CARDIAC SURGERY    . LAPAROSCOPY    . patent artero    . TONSILLECTOMY      SOCIAL HISTORY: Social History   Socioeconomic History  . Marital status: Married    Spouse name: Not on file  . Number of children: 1  . Years of education: Not on file  . Highest education level: Not on file  Occupational History  . Occupation: Print production planner  Tobacco Use  . Smoking status: Former Games developer  . Smokeless tobacco: Never Used  Vaping Use  . Vaping Use: Never used  Substance and Sexual Activity  . Alcohol use: Yes    Comment: Not since pregnant  . Drug use: Never  . Sexual activity: Yes    Birth control/protection: None  Other Topics Concern  . Not on file  Social History Narrative  . Not on file   Social Determinants of Health   Financial Resource Strain:   . Difficulty of Paying Living Expenses: Not on file  Food Insecurity:   . Worried About Programme researcher, broadcasting/film/video in the Last Year: Not on file  . Ran Out of Food in the Last Year: Not on file  Transportation Needs:   . Lack of Transportation (Medical): Not on file  . Lack of Transportation (Non-Medical): Not on file  Physical Activity:   . Days of Exercise per Week: Not on file  . Minutes of Exercise per Session: Not on file  Stress:   . Feeling of Stress : Not on file  Social Connections:   . Frequency of Communication with Friends and Family: Not on file  . Frequency of Social Gatherings with Friends and Family: Not on file  . Attends Religious Services: Not on file  . Active Member of Clubs or Organizations: Not on file  . Attends Banker Meetings: Not on file  . Marital Status: Not on file  Intimate Partner Violence:   . Fear of Current or Ex-Partner: Not on file  . Emotionally Abused: Not on file  . Physically Abused: Not on file   . Sexually Abused: Not on file    FAMILY HISTORY: Family History  Problem Relation Age of Onset  . Thalassemia Mother        Beta  . Multiple sclerosis Father   . Heart disease Father        Enlarged heart  . Breast cancer Maternal Grandmother   . Hypertension Maternal Grandmother   . Hypercholesterolemia Maternal Grandmother   . Heart attack Maternal Grandfather        multiple  . Diabetes Maternal Grandfather   . Breast cancer Paternal Grandmother   . Skin cancer Paternal Grandmother   . Huntington's disease Paternal Grandmother   . Fibromyalgia Paternal  Grandmother   . Heart disease Paternal Grandfather     ALLERGIES:  is allergic to metoclopramide.  MEDICATIONS:  Current Outpatient Medications  Medication Sig Dispense Refill  . albuterol (VENTOLIN HFA) 108 (90 Base) MCG/ACT inhaler Inhale 1 puff into the lungs every 6 (six) hours as needed for wheezing.  (Patient not taking: Reported on 08/26/2019)    . aspirin EC 81 MG tablet Take 1 tablet (81 mg total) by mouth daily. Take for prevention of preeclampsia in pregnancy (Patient not taking: Reported on 08/26/2019) 300 tablet 2  . folic acid (FOLVITE) 800 MCG tablet Take 400 mcg by mouth daily.    . iron polysaccharides (NIFEREX) 150 MG capsule Take 1 capsule (150 mg total) by mouth daily. (Patient not taking: Reported on 10/14/2019) 60 capsule 2  . Prenatal Vit-Fe Fumarate-FA (PRENATAL VITAMIN PO) Take by mouth.     No current facility-administered medications for this visit.    REVIEW OF SYSTEMS:   A 10+ POINT REVIEW OF SYSTEMS WAS OBTAINED including neurology, dermatology, psychiatry, cardiac, respiratory, lymph, extremities, GI, GU, Musculoskeletal, constitutional, breasts, reproductive, HEENT.  All pertinent positives are noted in the HPI.  All others are negative.   PHYSICAL EXAMINATION: ECOG PERFORMANCE STATUS: 1 - Symptomatic but completely ambulatory  . Vitals:   10/18/19 0948  BP: 116/71  Pulse: 70  Resp: 18   Temp: (!) 97.2 F (36.2 C)  SpO2: 100%   Filed Weights   10/18/19 0948  Weight: 198 lb 8 oz (90 kg)   .Body mass index is 35.16 kg/m.   GENERAL:alert, in no acute distress and comfortable SKIN: no acute rashes, no significant lesions EYES: conjunctiva are pink and non-injected, sclera anicteric OROPHARYNX: MMM, no exudates, no oropharyngeal erythema or ulceration NECK: supple, no JVD LYMPH:  no palpable lymphadenopathy in the cervical, axillary or inguinal regions LUNGS: clear to auscultation b/l with normal respiratory effort HEART: regular rate & rhythm ABDOMEN:  normoactive bowel sounds , non tender, not distended. No palpable hepatosplenomegaly.  Extremity: no pedal edema PSYCH: alert & oriented x 3 with fluent speech NEURO: no focal motor/sensory deficits  LABORATORY DATA:  I have reviewed the data as listed  . CBC Latest Ref Rng & Units 10/18/2019 10/14/2019 09/20/2019  WBC 4.0 - 10.5 K/uL 9.1 9.8 9.5  Hemoglobin 12.0 - 15.0 g/dL 9.5(M9.8(L) 8.4(X9.8(L) 3.2(G9.9(L)  Hematocrit 36 - 46 % 31.0(L) 33.3(L) 31.8(L)  Platelets 150 - 400 K/uL 259 267 257    . CMP Latest Ref Rng & Units 10/18/2019 10/14/2019 09/20/2019  Glucose 70 - 99 mg/dL 85 99 401(U109(H)  BUN 6 - 20 mg/dL 6 7 8   Creatinine 0.44 - 1.00 mg/dL 2.720.57 5.36(U0.48(L) 4.400.59  Sodium 135 - 145 mmol/L 134(L) 135 136  Potassium 3.5 - 5.1 mmol/L 3.6 3.3(L) 3.4(L)  Chloride 98 - 111 mmol/L 108 106 108  CO2 22 - 32 mmol/L 19(L) 21 20(L)  Calcium 8.9 - 10.3 mg/dL 3.4(V8.7(L) 8.6 9.2  Total Protein 6.5 - 8.1 g/dL 6.2(L) 5.7(L) 6.4(L)  Total Bilirubin 0.3 - 1.2 mg/dL 0.5 0.4 0.4  Alkaline Phos 38 - 126 U/L 71 - 59  AST 15 - 41 U/L 14(L) 12 11(L)  ALT 0 - 44 U/L 8 7 8      RADIOGRAPHIC STUDIES: I have personally reviewed the radiological images as listed and agreed with the findings in the report. No results found.  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 Lab Results  Component Value Date   IRON 106 10/18/2019   TIBC 438 10/18/2019   IRONPCTSAT 24 10/18/2019   (Iron and TIBC)  Lab Results  Component Value Date   FERRITIN 28 10/18/2019    PLAN: -Discussed pt labwork today, 10/18/19; all values are WNL except for Hgb at 9.8, HCT at 31.0, MCV at 64.4, MCH at 20.4, Abs Immature Granulocytes at 0.08K, Sodium at 134, CO2 at 19, Calcium at 8.7, Total Protein at 6.2, Albumin at 2.9, AST at 14. -Discussed 10/18/2019 Ferritin at 28 & Iron Panel shows: all values are WNL -Advised pt that her baseline Hgb will be around 10 during the third trimester of pregnancy due to her Beta Thalassemia trait. -Advised pt that our goal with PO Iron was to prevent iron deficiency adding to her anemia in pregnancy.   -Advised pt that she may be eligible for IV Iron after birth if her Hgb remains <9.  -Recommend pt try Slow Fe or 1/2 pill of Iron Polysaccharide every other day.  -Recommended that the pt drink at least 48-64 oz of water each day.  -Recommend pt eat smaller, more frequent meals.  -Continue pre-natal vitamins -Will see back as needed    FOLLOW UP: RTC with Dr Candise Che as needed   The total time spent in the appt was 20 minutes and more than 50% was on counseling and direct patient cares.  All of the patient's questions were answered with apparent satisfaction. The patient knows to call the clinic with any problems, questions or concerns.    Wyvonnia Lora MD MS AAHIVMS [facility] Hematology/Oncology Physician [facility]  (Office):       [phone] (Work cell):  [phone] (Fax):           [phone]  10/18/2019 11:49 AM  I, Carollee Herter, am acting as a scribe for Dr. Wyvonnia Lora.   .I have reviewed the above documentation for accuracy and completeness, and I agree with the above. Johney Maine MD

## 2019-10-28 ENCOUNTER — Other Ambulatory Visit: Payer: Self-pay

## 2019-10-28 ENCOUNTER — Ambulatory Visit (INDEPENDENT_AMBULATORY_CARE_PROVIDER_SITE_OTHER): Payer: PRIVATE HEALTH INSURANCE | Admitting: Obstetrics & Gynecology

## 2019-10-28 VITALS — BP 132/66 | HR 92 | Wt 201.0 lb

## 2019-10-28 DIAGNOSIS — Z8759 Personal history of other complications of pregnancy, childbirth and the puerperium: Secondary | ICD-10-CM

## 2019-10-28 DIAGNOSIS — O26899 Other specified pregnancy related conditions, unspecified trimester: Secondary | ICD-10-CM

## 2019-10-28 DIAGNOSIS — O0990 Supervision of high risk pregnancy, unspecified, unspecified trimester: Secondary | ICD-10-CM

## 2019-10-28 DIAGNOSIS — Z3A33 33 weeks gestation of pregnancy: Secondary | ICD-10-CM

## 2019-10-28 DIAGNOSIS — R109 Unspecified abdominal pain: Secondary | ICD-10-CM

## 2019-10-28 NOTE — Patient Instructions (Signed)
Return to office for any scheduled appointments. Call the office or go to the MAU at [facility] if:  You begin to have strong, frequent contractions  Your water breaks.  Sometimes it is a big gush of fluid, sometimes it is just a trickle that keeps getting your panties wet or running down your legs  You have vaginal bleeding.  It is normal to have a small amount of spotting if your cervix was checked.   You do not feel your baby moving like normal.  If you do not, get something to eat and drink and lay down and focus on feeling your baby move.   If your baby is still not moving like normal, you should call the office or go to MAU.  Any other obstetric concerns.   

## 2019-10-28 NOTE — Progress Notes (Signed)
   PRENATAL VISIT NOTE  Subjective:  Alicia Allen is a 34 y.o. G2P1001 at [redacted]w[redacted]d being seen today for ongoing prenatal care.  She is currently monitored for the following issues for this high-risk pregnancy and has Supervision of high risk pregnancy, antepartum; Wolff-Parkinson-White (WPW) syndrome; Beta thalassemia (HCC); Anemia affecting pregnancy in first trimester; and History of gestational hypertension on their problem list.  Patient reports occasional upper abdominal cramping, especially after urination. No dysuria, no suprapubic pain, recently has negative testing for UTI.  Contractions: Irritability. Vag. Bleeding: None.  Movement: Present. Denies leaking of fluid.   The following portions of the patient's history were reviewed and updated as appropriate: allergies, current medications, past family history, past medical history, past social history, past surgical history and problem list.   Objective:   Vitals:   10/28/19 1329  BP: 132/66  Pulse: 92  Weight: 201 lb (91.2 kg)    Fetal Status: Fetal Heart Rate (bpm): 138 Fundal Height: 34 cm Movement: Present     General:  Alert, oriented and cooperative. Patient is in no acute distress.  Skin: Skin is warm and dry. No rash noted.   Cardiovascular: Normal heart rate noted  Respiratory: Normal respiratory effort, no problems with respiration noted  Abdomen: Soft, gravid, appropriate for gestational age.  Pain/Pressure: Present     Pelvic: Cervical exam deferred        Extremities: Normal range of motion.  Edema: None  Mental Status: Normal mood and affect. Normal behavior. Normal judgment and thought content.   Assessment and Plan:  Pregnancy: G2P1001 at [redacted]w[redacted]d 1. Cramping affecting pregnancy, antepartum Reassured about this symptoms, likely uterine response after shifting when bladder is emptied.  PTL symptoms reviewed.  2. History of gestational hypertension Normal BP today, continue close monitoring.  3. [redacted] weeks gestation  of pregnancy 4. Supervision of high risk pregnancy, antepartum Preterm labor symptoms and general obstetric precautions including but not limited to vaginal bleeding, contractions, leaking of fluid and fetal movement were reviewed in detail with the patient. Please refer to After Visit Summary for other counseling recommendations.   Return in about 2 weeks (around 11/11/2019) for OFFICE OB Visit.  Future Appointments  Date Time Provider Department Center  11/11/2019  9:00 AM Conan Bowens, MD [facility]    Jaynie Collins, MD

## 2019-11-10 ENCOUNTER — Other Ambulatory Visit: Payer: Self-pay

## 2019-11-10 ENCOUNTER — Ambulatory Visit (INDEPENDENT_AMBULATORY_CARE_PROVIDER_SITE_OTHER): Payer: PRIVATE HEALTH INSURANCE | Admitting: Women's Health

## 2019-11-10 VITALS — BP 119/70 | HR 85 | Wt 203.0 lb

## 2019-11-10 DIAGNOSIS — Z23 Encounter for immunization: Secondary | ICD-10-CM

## 2019-11-10 DIAGNOSIS — I456 Pre-excitation syndrome: Secondary | ICD-10-CM

## 2019-11-10 DIAGNOSIS — D561 Beta thalassemia: Secondary | ICD-10-CM

## 2019-11-10 DIAGNOSIS — O99011 Anemia complicating pregnancy, first trimester: Secondary | ICD-10-CM

## 2019-11-10 DIAGNOSIS — Z3A35 35 weeks gestation of pregnancy: Secondary | ICD-10-CM

## 2019-11-10 DIAGNOSIS — Z8759 Personal history of other complications of pregnancy, childbirth and the puerperium: Secondary | ICD-10-CM

## 2019-11-10 DIAGNOSIS — O0990 Supervision of high risk pregnancy, unspecified, unspecified trimester: Secondary | ICD-10-CM | POA: Diagnosis not present

## 2019-11-10 NOTE — Addendum Note (Signed)
Addended by: Kathie Dike on: 11/10/2019 11:25 AM   Modules accepted: Orders

## 2019-11-10 NOTE — Progress Notes (Signed)
Subjective:  Alicia Allen is a 34 y.o. G2P1001 at [redacted]w[redacted]d being seen today for ongoing prenatal care.  She is currently monitored for the following issues for this high-risk pregnancy and has Supervision of high risk pregnancy, antepartum; Wolff-Parkinson-White (WPW) syndrome; Beta thalassemia (HCC); Anemia affecting pregnancy in first trimester; and History of gestational hypertension on their problem list.  Patient reports no complaints.  Contractions: Not present. Vag. Bleeding: None.  Movement: Present. Denies leaking of fluid.   The following portions of the patient's history were reviewed and updated as appropriate: allergies, current medications, past family history, past medical history, past social history, past surgical history and problem list. Problem list updated.  Objective:   Vitals:   11/10/19 1018  BP: 119/70  Pulse: 85  Weight: 203 lb (92.1 kg)    Fetal Status: Fetal Heart Rate (bpm): 143   Movement: Present     General:  Alert, oriented and cooperative. Patient is in no acute distress.  Skin: Skin is warm and dry. No rash noted.   Cardiovascular: Normal heart rate noted  Respiratory: Normal respiratory effort, no problems with respiration noted  Abdomen: Soft, gravid, appropriate for gestational age. Pain/Pressure: Present     Pelvic: Vag. Bleeding: None Vag D/C Character: Thin   Cervical exam deferred        Extremities: Normal range of motion.  Edema: None  Mental Status: Normal mood and affect. Normal behavior. Normal judgment and thought content.   Urinalysis:      Assessment and Plan:  Pregnancy: G2P1001 at [redacted]w[redacted]d  1. [redacted] weeks gestation of pregnancy  2. Supervision of high risk pregnancy, antepartum - anticipatory guidance given on upcoming visits - discussed flu and Tdap vaccine - pt declines flu, accepts Tdap - discussed contraception, pt reports husband will have vasectomy - peds list given, pt elects [facility] in [place]  3. History of  gestational hypertension - BP today 119/70 - pt not on low dose ASA, reports she never started taking it, pt advised not to start at this time - PEC labs today as patient reports leg swelling yesterday which is mild and WNL today and BP at home of 141/77 - discussed use of compression socks/stockings  4. Anemia affecting pregnancy in first trimester - pt on oral iron - pt reports normal hgb for her given BThal is 10.5/10.7 - CBC today     Component Value Date/Time   WBC 9.1 10/18/2019 0902   RBC 4.81 10/18/2019 0902   HGB 9.8 (L) 10/18/2019 0902   HCT 31.0 (L) 10/18/2019 0902   HCT 30.9 (L) 09/20/2019 1504   PLT 259 10/18/2019 0902   MCV 64.4 (L) 10/18/2019 0902   MCH 20.4 (L) 10/18/2019 0902   MCHC 31.6 10/18/2019 0902   RDW 14.8 10/18/2019 0902   LYMPHSABS 1.6 10/18/2019 0902   MONOABS 0.5 10/18/2019 0902   EOSABS 0.3 10/18/2019 0902   BASOSABS 0.1 10/18/2019 0902    5. Wolff-Parkinson-White (WPW) syndrome  6. Beta thalassemia (HCC)  Preterm labor symptoms and general obstetric precautions including but not limited to vaginal bleeding, contractions, leaking of fluid and fetal movement were reviewed in detail with the patient. I discussed the assessment and treatment plan with the patient. The patient was provided an opportunity to ask questions and all were answered. The patient agreed with the plan and demonstrated an understanding of the instructions. The patient was advised to call back or seek an in-person office evaluation/go to [facility] at [facility] for  any urgent or concerning symptoms. Please refer to After Visit Summary for other counseling recommendations.  Return in about 1 week (around 11/17/2019) for in-person HOB/MD ONLY/GBS/cultures.   [place] Gaut, Odie Sera, NP

## 2019-11-10 NOTE — Patient Instructions (Addendum)
Maternity Assessment Unit (MAU)  The Maternity Assessment Unit (MAU) is located at the [facility] [place] [facility]. The address is: [address] [place], [place], [place] [address]. Please see map below for additional directions.    The Maternity Assessment Unit is designed to help you during your pregnancy, and for up to 6 weeks after delivery, with any pregnancy- or postpartum-related emergencies, if you think you are in labor, or if your water has broken. For example, if you experience nausea and vomiting, vaginal bleeding, severe abdominal or pelvic pain, elevated blood pressure or other problems related to your pregnancy or postpartum time, please come to the Maternity Assessment Unit for assistance.        Preterm Labor and Birth Information  The normal length of a pregnancy is 39-41 weeks. Preterm labor is when labor starts before 37 completed weeks of pregnancy. What are the risk factors for preterm labor? Preterm labor is more likely to occur in women who:  Have certain infections during pregnancy such as a bladder infection, sexually transmitted infection, or infection inside the uterus (chorioamnionitis).  Have a shorter-than-normal cervix.  Have gone into preterm labor before.  Have had surgery on their cervix.  Are younger than age 64 or older than age 26.  Are African American.  Are pregnant with twins or multiple babies (multiple gestation).  Take street drugs or smoke while pregnant.  Do not gain enough weight while pregnant.  Became pregnant shortly after having been pregnant. What are the symptoms of preterm labor? Symptoms of preterm labor include:  Cramps similar to those that can happen during a menstrual period. The cramps may happen with diarrhea.  Pain in the abdomen or lower back.  Regular uterine contractions that may feel like tightening of the abdomen.  A feeling of increased pressure in the  pelvis.  Increased watery or bloody mucus discharge from the vagina.  Water breaking (ruptured amniotic sac). Why is it important to recognize signs of preterm labor? It is important to recognize signs of preterm labor because babies who are born prematurely may not be fully developed. This can put them at an increased risk for:  Long-term (chronic) heart and lung problems.  Difficulty immediately after birth with regulating body systems, including blood sugar, body temperature, heart rate, and breathing rate.  Bleeding in the brain.  Cerebral palsy.  Learning difficulties.  Death. These risks are highest for babies who are born before 38 weeks of pregnancy. How is preterm labor treated? Treatment depends on the length of your pregnancy, your condition, and the health of your baby. It may involve:  Having a stitch (suture) placed in your cervix to prevent your cervix from opening too early (cerclage).  Taking or being given medicines, such as: ? Hormone medicines. These may be given early in pregnancy to help support the pregnancy. ? Medicine to stop contractions. ? Medicines to help mature the baby's lungs. These may be prescribed if the risk of delivery is high. ? Medicines to prevent your baby from developing cerebral palsy. If the labor happens before 34 weeks of pregnancy, you may need to stay in the hospital. What should I do if I think I am in preterm labor? If you think that you are going into preterm labor, call your health care provider right away. How can I prevent preterm labor in future pregnancies? To increase your chance of having a full-term pregnancy:  Do not use any tobacco products, such as  cigarettes, chewing tobacco, and e-cigarettes. If you need help quitting, ask your health care provider.  Do not use street drugs or medicines that have not been prescribed to you during your pregnancy.  Talk with your health care provider before taking any herbal  supplements, even if you have been taking them regularly.  Make sure you gain a healthy amount of weight during your pregnancy.  Watch for infection. If you think that you might have an infection, get it checked right away.  Make sure to tell your health care provider if you have gone into preterm labor before. This information is not intended to replace advice given to you by your health care provider. Make sure you discuss any questions you have with your health care provider. Document Revised: 05/15/2018 Document Reviewed: 06/14/2015 Elsevier Patient Education  [place].        Group B Streptococcus Test During Pregnancy Why am I having this test? Routine testing, also called screening, for group B streptococcus (GBS) is recommended for all pregnant women between the 36th and 37th week of pregnancy. GBS is a type of bacteria that can be passed from mother to baby during childbirth. Screening will help guide whether or not you will need treatment during labor and delivery to prevent complications such as:  An infection in your uterus during labor.  An infection in your uterus after delivery.  A serious infection in your baby after delivery, such as pneumonia, meningitis, or sepsis. GBS screening is not often done before 36 weeks of pregnancy unless you go into labor prematurely. What happens if I have group B streptococcus? If testing shows that you have GBS, your health care provider will recommend treatment with IV antibiotics during labor and delivery. This treatment significantly decreases the risk of complications for you and your baby. If you have a planned C-section and you have GBS, you may not need to be treated with antibiotics because GBS is usually passed to babies after labor starts and your water breaks. If you are in labor or your water breaks before your C-section, it is possible for GBS to get into your uterus and be passed to your baby, so you might need  treatment. Is there a chance I may not need to be tested? You may not need to be tested for GBS if:  You have a urine test that shows GBS before 36 to 37 weeks.  You had a baby with GBS infection after a previous delivery. In these cases, you will automatically be treated for GBS during labor and delivery. What is being tested? This test is done to check if you have group B streptococcus in your vagina or rectum. What kind of sample is taken? To collect samples for this test, your health care provider will swab your vagina and rectum with a cotton swab. The sample is then sent to the lab to see if GBS is present. What happens during the test?   You will remove your clothing from the waist down.  You will lie down on an exam table in the same position as you would for a pelvic exam.  Your health care provider will swab your vagina and rectum to collect samples for a culture test.  You will be able to go home after the test and do all your usual activities. How are the results reported? The test results are reported as positive or negative. What do the results mean?  A positive test means you are at  risk for passing GBS to your baby during labor and delivery. Your health care provider will recommend that you are treated with an IV antibiotic during labor and delivery.  A negative test means you are at very low risk of passing GBS to your baby. There is still a low risk of passing GBS to your baby because sometimes test results may report that you do not have a condition when you do (false-negative result) or there is a chance that you may become infected with GBS after the test is done. You most likely will not need to be treated with an antibiotic during labor and delivery. Talk with your health care provider about what your results mean. Questions to ask your health care provider Ask your health care provider, or the department that is doing the test:  When will my results be  ready?  How will I get my results?  What are my treatment options? Summary  Routine testing (screening) for group B streptococcus (GBS) is recommended for all pregnant women between the 36th and 37th week of pregnancy.  GBS is a type of bacteria that can be passed from mother to baby during childbirth.  If testing shows that you have GBS, your health care provider will recommend that you are treated with IV antibiotics during labor and delivery. This treatment almost always prevents infection in newborns. This information is not intended to replace advice given to you by your health care provider. Make sure you discuss any questions you have with your health care provider. Document Revised: 05/14/2018 Document Reviewed: 02/18/2018 Elsevier Patient Education  2020 [employer].       Contraception Choices Contraception, also called birth control, refers to methods or devices that prevent pregnancy. Hormonal methods Contraceptive implant  A contraceptive implant is a thin, plastic tube that contains a hormone. It is inserted into the upper part of the arm. It can remain in place for up to 3 years. Progestin-only injections Progestin-only injections are injections of progestin, a synthetic form of the hormone progesterone. They are given every 3 months by a health care provider. Birth control pills  Birth control pills are pills that contain hormones that prevent pregnancy. They must be taken once a day, preferably at the same time each day. Birth control patch  The birth control patch contains hormones that prevent pregnancy. It is placed on the skin and must be changed once a week for three weeks and removed on the fourth week. A prescription is needed to use this method of contraception. Vaginal ring  A vaginal ring contains hormones that prevent pregnancy. It is placed in the vagina for three weeks and removed on the fourth week. After that, the process is repeated with a new  ring. A prescription is needed to use this method of contraception. Emergency contraceptive Emergency contraceptives prevent pregnancy after unprotected sex. They come in pill form and can be taken up to 5 days after sex. They work best the sooner they are taken after having sex. Most emergency contraceptives are available without a prescription. This method should not be used as your only form of birth control. Barrier methods Female condom  A female condom is a thin sheath that is worn over the penis during sex. Condoms keep sperm from going inside a woman's body. They can be used with a spermicide to increase their effectiveness. They should be disposed after a single use. Female condom  A female condom is a soft, loose-fitting sheath that is put  into the vagina before sex. The condom keeps sperm from going inside a woman's body. They should be disposed after a single use. Diaphragm  A diaphragm is a soft, dome-shaped barrier. It is inserted into the vagina before sex, along with a spermicide. The diaphragm blocks sperm from entering the uterus, and the spermicide kills sperm. A diaphragm should be left in the vagina for 6-8 hours after sex and removed within 24 hours. A diaphragm is prescribed and fitted by a health care provider. A diaphragm should be replaced every 1-2 years, after giving birth, after gaining more than 15 lb (6.8 kg), and after pelvic surgery. Cervical cap  A cervical cap is a round, soft latex or plastic cup that fits over the cervix. It is inserted into the vagina before sex, along with spermicide. It blocks sperm from entering the uterus. The cap should be left in place for 6-8 hours after sex and removed within 48 hours. A cervical cap must be prescribed and fitted by a health care provider. It should be replaced every 2 years. Sponge  A sponge is a soft, circular piece of polyurethane foam with spermicide on it. The sponge helps block sperm from entering the uterus, and the  spermicide kills sperm. To use it, you make it wet and then insert it into the vagina. It should be inserted before sex, left in for at least 6 hours after sex, and removed and thrown away within 30 hours. Spermicides Spermicides are chemicals that kill or block sperm from entering the cervix and uterus. They can come as a cream, jelly, suppository, foam, or tablet. A spermicide should be inserted into the vagina with an applicator at least 10-15 minutes before sex to allow time for it to work. The process must be repeated every time you have sex. Spermicides do not require a prescription. Intrauterine contraception Intrauterine device (IUD) An IUD is a T-shaped device that is put in a woman's uterus. There are two types:  Hormone IUD.This type contains progestin, a synthetic form of the hormone progesterone. This type can stay in place for 3-5 years.  Copper IUD.This type is wrapped in copper wire. It can stay in place for 10 years.  Permanent methods of contraception Female tubal ligation In this method, a woman's fallopian tubes are sealed, tied, or blocked during surgery to prevent eggs from traveling to the uterus. Hysteroscopic sterilization In this method, a small, flexible insert is placed into each fallopian tube. The inserts cause scar tissue to form in the fallopian tubes and block them, so sperm cannot reach an egg. The procedure takes about 3 months to be effective. Another form of birth control must be used during those 3 months. Female sterilization This is a procedure to tie off the tubes that carry sperm (vasectomy). After the procedure, the man can still ejaculate fluid (semen). Natural planning methods Natural family planning In this method, a couple does not have sex on days when the woman could become pregnant. Calendar method This means keeping track of the length of each menstrual cycle, identifying the days when pregnancy can happen, and not having sex on those  days. Ovulation method In this method, a couple avoids sex during ovulation. Symptothermal method This method involves not having sex during ovulation. The woman typically checks for ovulation by watching changes in her temperature and in the consistency of cervical mucus. Post-ovulation method In this method, a couple waits to have sex until after ovulation. Summary  Contraception, also called birth  control, means methods or devices that prevent pregnancy.  Hormonal methods of contraception include implants, injections, pills, patches, vaginal rings, and emergency contraceptives.  Barrier methods of contraception can include female condoms, female condoms, diaphragms, cervical caps, sponges, and spermicides.  There are two types of IUDs (intrauterine devices). An IUD can be put in a woman's uterus to prevent pregnancy for 3-5 years.  Permanent sterilization can be done through a procedure for males, females, or both.  Natural family planning methods involve not having sex on days when the woman could become pregnant. This information is not intended to replace advice given to you by your health care provider. Make sure you discuss any questions you have with your health care provider. Document Revised: 01/23/2017 Document Reviewed: 02/24/2016 Elsevier Patient Education  2020 Elsevier Inc.   Pregnancy and Influenza  Influenza, also called the flu, is an infection of the lungs and airways (respiratory tract). If you are pregnant, you are more likely to catch the flu. You are also more likely to have a more serious case of the flu. This is because pregnancy causes changes to your body's disease-fighting system (immune system), heart, and lungs. If you develop a bad case of the flu, especially with a high fever, this can cause problems for you and your developing baby. How do people get the flu? The flu is caused by a type of germ called a virus. It spreads when virus particles get passed  from person to person by:  Being near a sick person who is coughing or sneezing.  Touching something that has the virus on it and then touching your mouth, nose, or face. The influenza virus is most common during the fall and winter. How can I protect myself against the flu?  Get a flu shot. The best way to prevent the flu is to get a flu shot before flu season starts. The flu shot is not dangerous for your developing baby. It may even help protect your baby from the flu for up to 6 months after birth.  Wash your hands often with soap and warm water. If soap and water are not available, use hand sanitizer.  Do not come in close contact with sick people.  Do not share food, drinks, or utensils with other people.  Avoid touching your eyes, nose, and mouth.  Clean frequently used surfaces at home, school, or work.  Practice healthy lifestyle habits, such as: ? Eating a healthy, balanced diet. ? Drinking plenty of fluids. ? Exercising regularly or as told by your health care provider. ? Sleeping 7-9 hours each night. ? Finding ways to manage stress. What should I do if I have flu symptoms?  If you have any symptoms of the flu, even after getting a flu shot, contact your health care provider right away.  To reduce fever, take over-the-counter acetaminophen as told by your health care provider.  If you have the flu, you may get antiviral medicine to keep the flu from becoming severe and to shorten how long it lasts.  Avoid spreading the flu to others: ? Stay home until you are well. ? Cover your nose and mouth when you cough or sneeze. ? Wash your hands often. Follow these instructions at home:  Take over-the-counter and prescription medicines only as told by your health care provider. Do not take any medicine, including cold or flu medicine, unless your health care provider tells you to do so.  If you were prescribed antiviral medicine, take it as  told by your health care  provider. Do not stop taking the antiviral medicine even if you start to feel better.  Eat a nutrient-rich diet that includes fresh fruits and vegetables, whole grains, lean protein, and low-fat dairy.  Drink enough fluid to keep your urine clear or pale yellow.  Get plenty of rest. Contact a health care provider if:  You have fever or chills.  You have a cough, sore throat, or stuffy nose.  You have worsening or unusual: ? Muscle aches. ? Headache. ? Tiredness. ? Loss of appetite.  You have vomiting or diarrhea. Get help right away if:  You have trouble breathing.  You have chest pain.  You have abdominal pain.  You begin to have labor pains.  You have a fever that does not go down 24 hours after you take medicine.  You do not feel your baby move.  You have diarrhea or vomiting that will not go away.  You have dizziness or confusion.  Your symptoms do not improve, even with treatment. Summary  If you are pregnant, you are more likely to catch the flu. You are also more likely to have a more serious case of the flu.  If you have flu-like symptoms, call your health care provider right away. If you develop a bad case of the flu, especially with a high fever, this can be dangerous for your developing baby.  The best way to prevent the flu is to get a flu shot before flu season starts. The flu shot is not dangerous for your developing baby.  If you have the flu and were prescribed antiviral medicine, take it as told by your health care provider. This information is not intended to replace advice given to you by your health care provider. Make sure you discuss any questions you have with your health care provider. Document Revised: 05/15/2018 Document Reviewed: 03/19/2016 Elsevier Patient Education  2020 Elsevier Inc.       AREA PEDIATRIC/FAMILY PRACTICE PHYSICIANS  [facility] [address] [place], [place] [address] Phone -  [phone]   Fax - [phone]  JACK AMOS [address] [place], [place]  [address] Phone - [phone]   Fax - [phone]  [facility] [address] [place], [place]  [address] Phone - [phone]   Fax - [phone]  [facility] [address] [place], [place]  [address] Phone - [phone]   Fax - [phone]  [facility] [address] [place], [place]  [address] Phone - [phone]   Fax - [phone]  [facility] [address] [place], [place]  [address] Phone - [phone]   Fax - [phone]  [facility] [place] [address] [place], [place]  [address] Phone - [phone]   Fax - [phone]  [facility] [address] [place], [address] [place], [place]  [address] Phone - [phone]   Fax - [phone]  [facility] AT [facility] [address] [place], [place]  [address] Phone - [phone]   Fax - [phone] [facility] AT [place] [address] [place], [place]  [address] Phone - [phone]   Fax - [phone]  [facility] [address] [place], [place]  [address] Phone - [phone]   Fax - [phone]  [facility] [address] [place], [place]  [address]  Phone - [phone]   Fax - [phone]  [facility] [place] [address] [place], [place]  [address] Phone - [phone]   Fax - [phone]  [facility] [address] [place], [place]  [address] Phone - [phone]  [facility] [address] [place], [place]  [address] Phone - [phone]   Fax - [phone]  [facility] [address] [place], [place]  [address] Phone - [phone]   Fax - [phone]  [facility] [address] [place], [place]  [address] Phone - [phone]   Fax - [phone]   [facility] [address] [place], [place]  [address] Phone - [phone]   Fax - [phone]   [facility] [address] [place], [place]  [address] Phone - [phone]   Fax - [phone]  [facility] [address] [place], [place]  [address] Phone - [phone]   Fax - [phone]  DAVID RUBIN [address] [place], [place]  [address] Phone - [phone]   Fax - [phone]  [facility] [address] [place], [place]  [address] Phone - [phone]   Fax - [phone]  [place] [facility] Alita Chyle [address] [place], [place]  [address] Phone - [phone]   Fax - [phone] Aseneth Hack [address] [place], [place]  [address] Phone - [phone]   Fax - [phone]  [facility] [address] [place], [place]  [address] Phone - [phone]   Fax - [phone]  [facility] - [place] [address] [place], [place]  [address] Phone - [phone]   Fax - [phone]          Preeclampsia and Eclampsia Preeclampsia is a serious condition that may develop during pregnancy. This condition causes high blood pressure and increased protein in your urine along with other symptoms, such as headaches and vision changes. These symptoms may develop as the condition gets worse. Preeclampsia may occur at 20 weeks of pregnancy or later. Diagnosing and treating preeclampsia early is very important. If not treated early, it can cause serious problems for you and your baby. One problem it can lead to is eclampsia. Eclampsia is a condition that causes muscle jerking or shaking (convulsions or seizures) and other serious problems for the mother. During pregnancy, delivering your baby may be the best treatment for preeclampsia or eclampsia.  For most women, preeclampsia and eclampsia symptoms go away after giving birth. In rare cases, a woman may develop preeclampsia after giving birth (postpartum preeclampsia). This usually occurs within 48 hours after childbirth but may occur up to 6 weeks after giving birth. What are the causes? The cause of preeclampsia is not known. What increases the risk? The following risk factors make you more likely to develop preeclampsia:  Being pregnant for the first time.  Having had preeclampsia during a past pregnancy.  Having a family history of preeclampsia.  Having high blood pressure.  Being pregnant with more than one baby.  Being 57 or older.  Being African-American.  Having kidney disease or diabetes.  Having medical conditions such as lupus or blood diseases.  Being very overweight (obese). What are the signs or symptoms? The most common symptoms are:  Severe headaches.  Vision problems, such as blurred or double vision.  Abdominal pain, especially upper abdominal pain. Other symptoms that may develop as  the condition gets worse include:  Sudden weight gain.  Sudden swelling of the hands, face, legs, and feet.  Severe nausea and vomiting.  Numbness in the face, arms, legs, and feet.  Dizziness.  Urinating less than usual.  Slurred speech.  Convulsions or seizures. How is this diagnosed? There are no screening tests for preeclampsia. Your health care provider will ask you about symptoms and check for signs of preeclampsia during your prenatal visits. You may also have tests that include:  Checking your blood pressure.  Urine tests to check for protein. Your health care provider will check for this at every prenatal visit.  Blood tests.  Monitoring your baby's heart rate.  Ultrasound. How is this treated? You and your health care provider will determine the treatment approach that is best for you. Treatment may include:  Having more frequent prenatal  exams to check for signs of preeclampsia, if you have an increased risk for preeclampsia.  Medicine to lower your blood pressure.  Staying in the hospital, if your condition is severe. There, treatment will focus on controlling your blood pressure and the amount of fluids in your body (fluid retention).  Taking medicine (magnesium sulfate) to prevent seizures. This may be given as an injection or through an IV.  Taking a low-dose aspirin during your pregnancy.  Delivering your baby early. You may have your labor started with medicine (induced), or you may have a cesarean delivery. Follow these instructions at home: Eating and drinking   Drink enough fluid to keep your urine pale yellow.  Avoid caffeine. Lifestyle  Do not use any products that contain nicotine or tobacco, such as cigarettes and e-cigarettes. If you need help quitting, ask your health care provider.  Do not use alcohol or drugs.  Avoid stress as much as possible. Rest and get plenty of sleep. General instructions  Take over-the-counter and prescription medicines only as told by your health care provider.  When lying down, lie on your left side. This keeps pressure off your major blood vessels.  When sitting or lying down, raise (elevate) your feet. Try putting some pillows underneath your lower legs.  Exercise regularly. Ask your health care provider what kinds of exercise are best for you.  Keep all follow-up and prenatal visits as told by your health care provider. This is important. How is this prevented? There is no known way of preventing preeclampsia or eclampsia from developing. However, to lower your risk of complications and detect problems early:  Get regular prenatal care. Your health care provider may be able to diagnose and treat the condition early.  Maintain a healthy weight. Ask your health care provider for help managing weight gain during pregnancy.  Work with your health care provider to  manage any long-term (chronic) health conditions you have, such as diabetes or kidney problems.  You may have tests of your blood pressure and kidney function after giving birth.  Your health care provider may have you take low-dose aspirin during your next pregnancy. Contact a health care provider if:  You have symptoms that your health care provider told you may require more treatment or monitoring, such as: ? Headaches. ? Nausea or vomiting. ? Abdominal pain. ? Dizziness. ? Light-headedness. Get help right away if:  You have severe: ? Abdominal pain. ? Headaches that do not get better. ? Dizziness. ? Vision problems. ? Confusion. ? Nausea or vomiting.  You have any of the following: ? A seizure. ? Sudden, rapid weight gain. ?  Sudden swelling in your hands, ankles, or face. ? Trouble moving any part of your body. ? Numbness in any part of your body. ? Trouble speaking. ? Abnormal bleeding.  You faint. Summary  Preeclampsia is a serious condition that may develop during pregnancy.  This condition causes high blood pressure and increased protein in your urine along with other symptoms, such as headaches and vision changes.  Diagnosing and treating preeclampsia early is very important. If not treated early, it can cause serious problems for you and your baby.  Get help right away if you have symptoms that your health care provider told you to watch for. This information is not intended to replace advice given to you by your health care provider. Make sure you discuss any questions you have with your health care provider. Document Revised: 09/23/2017 Document Reviewed: 08/28/2015 Elsevier Patient Education  2020 [employer]       https://www.cdc.gov/vaccines/hcp/vis/vis-statements/tdap.pdf">  Tdap (Tetanus, Diphtheria, Pertussis) Vaccine: What You Need to Know 1. Why get vaccinated? Tdap vaccine can prevent tetanus, diphtheria, and pertussis. Diphtheria and  pertussis spread from person to person. Tetanus enters the body through cuts or wounds.  TETANUS (T) causes painful stiffening of the muscles. Tetanus can lead to serious health problems, including being unable to open the mouth, having trouble swallowing and breathing, or death.  DIPHTHERIA (D) can lead to difficulty breathing, heart failure, paralysis, or death.  PERTUSSIS (aP), also known as "whooping cough," can cause uncontrollable, violent coughing which makes it hard to breathe, eat, or drink. Pertussis can be extremely serious in babies and young children, causing pneumonia, convulsions, brain damage, or death. In teens and adults, it can cause weight loss, loss of bladder control, passing out, and rib fractures from severe coughing. 2. Tdap vaccine Tdap is only for children 7 years and older, adolescents, and adults.  Adolescents should receive a single dose of Tdap, preferably at age 34 or 12 years. Pregnant women should get a dose of Tdap during every pregnancy, to protect the newborn from pertussis. Infants are most at risk for severe, life-threatening complications from pertussis. Adults who have never received Tdap should get a dose of Tdap. Also, adults should receive a booster dose every 10 years, or earlier in the case of a severe and dirty wound or burn. Booster doses can be either Tdap or Td (a different vaccine that protects against tetanus and diphtheria but not pertussis). Tdap may be given at the same time as other vaccines. 3. Talk with your health care provider Tell your vaccine provider if the person getting the vaccine:  Has had an allergic reaction after a previous dose of any vaccine that protects against tetanus, diphtheria, or pertussis, or has any severe, life-threatening allergies.  Has had a coma, decreased level of consciousness, or prolonged seizures within 7 days after a previous dose of any pertussis vaccine (DTP, DTaP, or Tdap).  Has seizures or another  nervous system problem.  Has ever had Guillain-Barr Syndrome (also called GBS).  Has had severe pain or swelling after a previous dose of any vaccine that protects against tetanus or diphtheria. In some cases, your health care provider may decide to postpone Tdap vaccination to a future visit.  People with minor illnesses, such as a cold, may be vaccinated. People who are moderately or severely ill should usually wait until they recover before getting Tdap vaccine.  Your health care provider can give you more information. 4. Risks of a vaccine reaction  Pain, redness,  or swelling where the shot was given, mild fever, headache, feeling tired, and nausea, vomiting, diarrhea, or stomachache sometimes happen after Tdap vaccine. People sometimes faint after medical procedures, including vaccination. Tell your provider if you feel dizzy or have vision changes or ringing in the ears.  As with any medicine, there is a very remote chance of a vaccine causing a severe allergic reaction, other serious injury, or death. 5. What if there is a serious problem? An allergic reaction could occur after the vaccinated person leaves the clinic. If you see signs of a severe allergic reaction (hives, swelling of the face and throat, difficulty breathing, a fast heartbeat, dizziness, or weakness), call 9-1-1 and get the person to the nearest hospital. For other signs that concern you, call your health care provider.  Adverse reactions should be reported to the Vaccine Adverse Event Reporting System (VAERS). Your health care provider will usually file this report, or you can do it yourself. Visit the VAERS website at www.vaers.LAgents.no or call 281-644-0661. VAERS is only for reporting reactions, and VAERS staff do not give medical advice. 6. The National Vaccine Injury Compensation Program The [employer] Vaccine Injury Compensation Program (VICP) is a federal program that was created to compensate people who may have been  injured by certain vaccines. Visit the VICP website at SpiritualWord.at or call (586)840-2318 to learn about the program and about filing a claim. There is a time limit to file a claim for compensation. 7. How can I learn more?  Ask your health care provider.  Call your local or state health department.  Contact the Centers for Disease Control and Prevention (CDC): ? Call 778-673-6560 (1-800-CDC-INFO) or ? Visit CDC's website at PicCapture.uy Vaccine Information Statement Tdap (Tetanus, Diphtheria, Pertussis) Vaccine (05/06/2018) This information is not intended to replace advice given to you by your health care provider. Make sure you discuss any questions you have with your health care provider. Document Revised: 05/15/2018 Document Reviewed: 05/18/2018 Elsevier Patient Education  2020 [employer].

## 2019-11-11 ENCOUNTER — Encounter: Payer: PRIVATE HEALTH INSURANCE | Admitting: Obstetrics and Gynecology

## 2019-11-11 LAB — COMPREHENSIVE METABOLIC PANEL
AG Ratio: 1.5 (calc) (ref 1.0–2.5)
ALT: 9 U/L (ref 6–29)
AST: 13 U/L (ref 10–30)
Albumin: 3.4 g/dL — ABNORMAL LOW (ref 3.6–5.1)
Alkaline phosphatase (APISO): 84 U/L (ref 31–125)
BUN/Creatinine Ratio: 13 (calc) (ref 6–22)
BUN: 6 mg/dL — ABNORMAL LOW (ref 7–25)
CO2: 21 mmol/L (ref 20–32)
Calcium: 8.8 mg/dL (ref 8.6–10.2)
Chloride: 107 mmol/L (ref 98–110)
Creat: 0.46 mg/dL — ABNORMAL LOW (ref 0.50–1.10)
Globulin: 2.3 g/dL (calc) (ref 1.9–3.7)
Glucose, Bld: 75 mg/dL (ref 65–99)
Potassium: 4.1 mmol/L (ref 3.5–5.3)
Sodium: 135 mmol/L (ref 135–146)
Total Bilirubin: 0.5 mg/dL (ref 0.2–1.2)
Total Protein: 5.7 g/dL — ABNORMAL LOW (ref 6.1–8.1)

## 2019-11-11 LAB — PROTEIN / CREATININE RATIO, URINE
Creatinine, Urine: 30 mg/dL (ref 20–275)
Protein/Creat Ratio: 200 mg/g creat — ABNORMAL HIGH (ref 21–161)
Protein/Creatinine Ratio: 0.2 mg/mg creat — ABNORMAL HIGH (ref 0.021–0.16)
Total Protein, Urine: 6 mg/dL (ref 5–24)

## 2019-11-11 LAB — CBC
HCT: 32.7 % — ABNORMAL LOW (ref 35.0–45.0)
Hemoglobin: 10.4 g/dL — ABNORMAL LOW (ref 11.7–15.5)
MCH: 20.4 pg — ABNORMAL LOW (ref 27.0–33.0)
MCHC: 31.8 g/dL — ABNORMAL LOW (ref 32.0–36.0)
MCV: 64.1 fL — ABNORMAL LOW (ref 80.0–100.0)
MPV: 11.5 fL (ref 7.5–12.5)
Platelets: 262 10*3/uL (ref 140–400)
RBC: 5.1 10*6/uL (ref 3.80–5.10)
RDW: 15.1 % — ABNORMAL HIGH (ref 11.0–15.0)
WBC: 10 10*3/uL (ref 3.8–10.8)

## 2019-11-18 ENCOUNTER — Other Ambulatory Visit: Payer: Self-pay

## 2019-11-18 ENCOUNTER — Encounter: Payer: Self-pay | Admitting: Obstetrics and Gynecology

## 2019-11-18 ENCOUNTER — Other Ambulatory Visit (HOSPITAL_COMMUNITY)
Admission: RE | Admit: 2019-11-18 | Discharge: 2019-11-18 | Disposition: A | Discharge status: Home / Self Care | Payer: PRIVATE HEALTH INSURANCE | Source: Ambulatory Visit | Attending: Obstetrics and Gynecology | Admitting: Obstetrics and Gynecology

## 2019-11-18 ENCOUNTER — Ambulatory Visit (INDEPENDENT_AMBULATORY_CARE_PROVIDER_SITE_OTHER): Payer: PRIVATE HEALTH INSURANCE | Admitting: Obstetrics and Gynecology

## 2019-11-18 VITALS — BP 120/70 | HR 76 | Wt 203.0 lb

## 2019-11-18 DIAGNOSIS — Z3A36 36 weeks gestation of pregnancy: Secondary | ICD-10-CM | POA: Insufficient documentation

## 2019-11-18 DIAGNOSIS — O0993 Supervision of high risk pregnancy, unspecified, third trimester: Secondary | ICD-10-CM | POA: Insufficient documentation

## 2019-11-18 DIAGNOSIS — Z8759 Personal history of other complications of pregnancy, childbirth and the puerperium: Secondary | ICD-10-CM

## 2019-11-18 DIAGNOSIS — O0990 Supervision of high risk pregnancy, unspecified, unspecified trimester: Secondary | ICD-10-CM

## 2019-11-18 NOTE — Progress Notes (Signed)
   PRENATAL VISIT NOTE  Subjective:  Alicia Allen is a 34 y.o. G2P1001 at 108w4d being seen today for ongoing prenatal care.  She is currently monitored for the following issues for this high-risk pregnancy and has Supervision of high risk pregnancy, antepartum; Wolff-Parkinson-White (WPW) syndrome; Beta thalassemia (HCC); Anemia affecting pregnancy in first trimester; and History of gestational hypertension on their problem list.  Patient reports heart palpitation and near syncope several times a day lately.  Contractions: Irritability. Vag. Bleeding: None.  Movement: Present. Denies leaking of fluid.   The following portions of the patient's history were reviewed and updated as appropriate: allergies, current medications, past family history, past medical history, past social history, past surgical history and problem list.   Objective:   Vitals:   11/18/19 1513  BP: 120/70  Pulse: 76  Weight: 203 lb (92.1 kg)    Fetal Status: Fetal Heart Rate (bpm): 144 Fundal Height: 36 cm Movement: Present     General:  Alert, oriented and cooperative. Patient is in no acute distress.  Skin: Skin is warm and dry. No rash noted.   Cardiovascular: Normal heart rate noted  Respiratory: Normal respiratory effort, no problems with respiration noted  Abdomen: Soft, gravid, appropriate for gestational age.  Pain/Pressure: Present     Pelvic: Cervical exam performed in the presence of a chaperone Dilation: Fingertip Effacement (%): Thick Station: Ballotable  Extremities: Normal range of motion.  Edema: Trace  Mental Status: Normal mood and affect. Normal behavior. Normal judgment and thought content.   Assessment and Plan:  Pregnancy: G2P1001 at [redacted]w[redacted]d 1. [redacted] weeks gestation of pregnancy  - Culture, beta strep (group b only) - GC/Chlamydia probe amp ([place])not at [facility]  2. Supervision of high risk pregnancy, antepartum Patient is doing well without complaints Cultures today Patient referred to  cardiology - Culture, beta strep (group b only) - GC/Chlamydia probe amp ()not at [facility]  3. History of gestational hypertension Normotensive today Reviewed normal PIH labs from last visit Patient is interested in elective induction of labor and does not want to receiving pitocin. Discussed the need for cervical dilation. Will re-evaluate in a few weeks and sweep membranes if possible  Preterm labor symptoms and general obstetric precautions including but not limited to vaginal bleeding, contractions, leaking of fluid and fetal movement were reviewed in detail with the patient. Please refer to After Visit Summary for other counseling recommendations.   Return in about 1 week (around 11/25/2019) for in person, ROB, High risk.  No future appointments.  Catalina Antigua, MD

## 2019-11-21 LAB — CULTURE, BETA STREP (GROUP B ONLY)
MICRO NUMBER:: 11075742
SPECIMEN QUALITY:: ADEQUATE

## 2019-11-22 LAB — GC/CHLAMYDIA PROBE AMP (~~LOC~~) NOT AT ARMC
Chlamydia: NEGATIVE
Comment: NEGATIVE
Comment: NORMAL
Neisseria Gonorrhea: NEGATIVE

## 2019-11-24 ENCOUNTER — Ambulatory Visit (INDEPENDENT_AMBULATORY_CARE_PROVIDER_SITE_OTHER): Payer: PRIVATE HEALTH INSURANCE | Admitting: Obstetrics and Gynecology

## 2019-11-24 ENCOUNTER — Other Ambulatory Visit: Payer: Self-pay

## 2019-11-24 VITALS — BP 128/73 | HR 77 | Wt 206.0 lb

## 2019-11-24 DIAGNOSIS — Z3A37 37 weeks gestation of pregnancy: Secondary | ICD-10-CM

## 2019-11-24 DIAGNOSIS — O0990 Supervision of high risk pregnancy, unspecified, unspecified trimester: Secondary | ICD-10-CM

## 2019-11-24 NOTE — Progress Notes (Signed)
   PRENATAL VISIT NOTE  Subjective:  Alicia Allen is a 34 y.o. G2P1001 at [redacted]w[redacted]d being seen today for ongoing prenatal care.  She is currently monitored for the following issues for this low-risk pregnancy and has Supervision of high risk pregnancy, antepartum; Wolff-Parkinson-White (WPW) syndrome; Beta thalassemia (HCC); Anemia affecting pregnancy in first trimester; History of gestational hypertension; and [redacted] weeks gestation of pregnancy on their problem list.  Patient reports no complaints.  Contractions: Irritability. Vag. Bleeding: None.  Movement: Present. Denies leaking of fluid.   The following portions of the patient's history were reviewed and updated as appropriate: allergies, current medications, past family history, past medical history, past social history, past surgical history and problem list.   Objective:   Vitals:   11/24/19 1052  BP: 128/73  Pulse: 77  Weight: 206 lb (93.4 kg)    Fetal Status: Fetal Heart Rate (bpm): 151 Fundal Height: 37 cm Movement: Present     General:  Alert, oriented and cooperative. Patient is in no acute distress.  Skin: Skin is warm and dry. No rash noted.   Cardiovascular: Normal heart rate noted  Respiratory: Normal respiratory effort, no problems with respiration noted  Abdomen: Soft, gravid, appropriate for gestational age.  Pain/Pressure: Present     Pelvic: Cervical exam performed in the presence of a chaperone Dilation: 1 Effacement (%): Thick Station: Ballotable  Extremities: Normal range of motion.  Edema: Trace  Mental Status: Normal mood and affect. Normal behavior. Normal judgment and thought content.   Assessment and Plan:  Pregnancy: G2P1001 at [redacted]w[redacted]d  1. [redacted] weeks gestation of pregnancy  GBS negative   2. Supervision of high risk pregnancy, antepartum  Patient reports she had an elevated BP reading at home yesterday. And then a normal reading. BP today normal.  Continue to monitor BP at home.  She had one elevated BP in  the office at 31 weeks- she does not meet criteria for GHTN.  Term labor symptoms and general obstetric precautions including but not limited to vaginal bleeding, contractions, leaking of fluid and fetal movement were reviewed in detail with the patient. Please refer to After Visit Summary for other counseling recommendations.   Return in about 1 week (around 12/01/2019).  Future Appointments  Date Time Provider Department Center  11/30/2019  3:10 PM Leftwich-Kirby, Wilmer Floor, CNM [facility]    Venia Carbon, NP

## 2019-11-24 NOTE — Patient Instructions (Signed)
The MilesCircuit  This circuit takes at least 90 minutes to complete so clear your schedule and make mental preparations so you can relax in your environment. The second step requires a lot of pillows so gather them up before beginning Before starting, you should empty your bladder! Have a nice drink nearby, and make sure it has a straw! If you are having contractions, this circuit should be done through contractions, try not to change positions between steps Before you begin...  "I named this 'circuit' after my friend Alicia Allen, who shared and discussed it with me when I was working with a client whose labor seemed to be stalled out and no longer progressing... This circuit is useful to help get the baby lined up, ideally, in the "Left Occiput Anterior" (LOA) Position, both before labor begins and when some corrections need to be done during labor. Prenatally, this position set can help to rotate a baby. As a natural method of induction, this can help get things going if baby just needed a gentle nudge of position to set things off. To the best of my knowledge, this group of positions will not "hurt" a baby that is already lined up correctly." - Alicia Allen   Step One: Open-knee Chest Stay in this position for 30 minutes, start in cat/cow, then drop your chest as low as you can to the bed or the floor and your bottom as high as you can. Knees should be fairly wide apart, and the angle between the torso/thighs should be wider than 90 degrees. Wiggle around, prop with lots of pillows and use this time to get totally relaxed. This position allows the baby to scoot out of the pelvis a bit and gives them room to rotate, shift their head position, etc. If the pregnant person finds it helpful, careful positioning with a rebozo under the belly, with gentle tension from a support person behind can help maintain this position for the full 30 minutes.  Step Two:Exaggerated Left Side  Lying Roll to your left side, bringing your top leg as high as possible and keeping your bottom leg straight. Roll forward as much as possible, again using a lot of pillows. Sink into the bed and relax some more. If you fall asleep, that's totally okay and you can stay there! If not, stay here for at least another half an hour. Try and get your top right leg up towards your head and get as rolled over onto your belly as much as possible. If you repeat the circuit during labor, try alternating left and right sides. We know the photo the left is actually right side... just flip the image in your head.  Step Three: Moving and Lunges Lunge, walk stairs facing sideways, 2 at a time, (have a spotter downstairs of you!), take a walk outside with one foot on the curb and the other on the street, sit on a birth ball and hula- anything that's upright and putting your pelvis in open, asymmetrical positions. Spend at least 30 minutes doing this one as well to give your baby a chance to move down. If you are lunging or stair or curb walking, you should lunge/walk/go up stairs in the direction that feels better to you. The key with the lunge is that the toes of the higher leg and mom's belly button should be at right angles. Do not lunge over your knee, that closes the pelvis.     Alicia Hamilton Allen: Circuit Creator - [email or web address] Alicia   Allen, CD, BDT (DONA), LCCE, FACCE: Supporting Content - [email or web address] Alicia Allen: Photography - [email or web address] Alicia Allen CD/CDT (BAI): Print and Webmaster - [email or web address] Miles[employer]uit [employer] [email or web address]  

## 2019-11-30 ENCOUNTER — Other Ambulatory Visit: Payer: Self-pay

## 2019-11-30 ENCOUNTER — Ambulatory Visit (INDEPENDENT_AMBULATORY_CARE_PROVIDER_SITE_OTHER): Payer: PRIVATE HEALTH INSURANCE | Admitting: Advanced Practice Midwife

## 2019-11-30 VITALS — BP 138/86 | HR 111 | Wt 206.0 lb

## 2019-11-30 DIAGNOSIS — Z3A38 38 weeks gestation of pregnancy: Secondary | ICD-10-CM

## 2019-11-30 DIAGNOSIS — O0990 Supervision of high risk pregnancy, unspecified, unspecified trimester: Secondary | ICD-10-CM

## 2019-11-30 DIAGNOSIS — I456 Pre-excitation syndrome: Secondary | ICD-10-CM

## 2019-11-30 DIAGNOSIS — R0981 Nasal congestion: Secondary | ICD-10-CM

## 2019-11-30 NOTE — Progress Notes (Signed)
   PRENATAL VISIT NOTE  Subjective:  Alicia Allen is a 34 y.o. G2P1001 at [redacted]w[redacted]d being seen today for ongoing prenatal care.  She is currently monitored for the following issues for this low-risk pregnancy and has Supervision of high risk pregnancy, antepartum; Wolff-Parkinson-White (WPW) syndrome; Beta thalassemia (HCC); Anemia affecting pregnancy in first trimester; History of gestational hypertension; and [redacted] weeks gestation of pregnancy on their problem list.  Patient reports occasional contractions.  Contractions: Irritability. Vag. Bleeding: None.  Movement: Present. Denies leaking of fluid.   The following portions of the patient's history were reviewed and updated as appropriate: allergies, current medications, past family history, past medical history, past social history, past surgical history and problem list.   Objective:   Vitals:   11/30/19 1513  BP: 138/86  Pulse: (!) 111  Weight: 206 lb (93.4 kg)    Fetal Status: Fetal Heart Rate (bpm): 140 Fundal Height: 38 cm Movement: Present     General:  Alert, oriented and cooperative. Patient is in no acute distress.  Skin: Skin is warm and dry. No rash noted.   Cardiovascular: Normal heart rate noted  Respiratory: Normal respiratory effort, no problems with respiration noted  Abdomen: Soft, gravid, appropriate for gestational age.  Pain/Pressure: Present     Pelvic: Cervical exam performed in the presence of a chaperone Dilation: Fingertip Effacement (%): Thick Station: -3  Extremities: Normal range of motion.  Edema: Trace  Mental Status: Normal mood and affect. Normal behavior. Normal judgment and thought content.   Assessment and Plan:  Pregnancy: G2P1001 at [redacted]w[redacted]d 1. Supervision of high risk pregnancy, antepartum --Anticipatory guidance about next visits/weeks of pregnancy given. --Reviewed labor readiness/cervical ripening. Cervix checked at pt request, FT/thick/-3, soft, vertex --Next visit in 1 week in office   2.  Wolff-Parkinson-White (WPW) syndrome   3. [redacted] weeks gestation of pregnancy  4. Nasal congestion --Pt woke up with thick nasal congestion but denies any symptoms now.  Reviewed safe OTC medications including saline nasal spray, antihistamines.  Reviewed reasons to seek care.    Term labor symptoms and general obstetric precautions including but not limited to vaginal bleeding, contractions, leaking of fluid and fetal movement were reviewed in detail with the patient. Please refer to After Visit Summary for other counseling recommendations.   Return in about 1 week (around 12/07/2019).  Future Appointments  Date Time Provider Department Center  12/07/2019  2:10 PM Sharyon Cable, CNM [facility]    Sharen Counter, CNM

## 2019-11-30 NOTE — Patient Instructions (Signed)
Things to Try After 37 weeks to Encourage Labor/Get Ready for Labor:   1.  Try the Miles Circuit at [email or web address] daily to improve baby's position and encourage the onset of labor.  2. Walk a little and rest a little every day.  Change positions often.  3. Cervical Ripening: May try one or both a. Red Raspberry Leaf capsules or tea:  two 300mg or 400mg tablets with each meal, 2-3 times a day, or 1-3 cups of tea daily  Potential Side Effects Of Raspberry Leaf:  Most women do not experience any side effects from drinking raspberry leaf tea. However, nausea and loose stools are possible   b. Evening Primrose Oil capsules: may take 1 to 3 capsules daily. Take 1-2 capsules by mouth each day and place one capsule vaginally at night.  You may also prick the vaginal capsule to release the oil prior to inserting in the vagina. Some of the potential side effects:  Upset stomach  Loose stools or diarrhea  Headaches  Nausea  4. Sex (and especially sex with orgasm) can also help the cervix ripen and encourage labor onset.  Labor Precautions Reasons to come to [facility] at [facility]:  1.  Contractions are  5 minutes apart or less, each last 1 minute, these have been going on for 1-2 hours, and you cannot walk or talk during them 2.  You have a large gush of fluid, or a trickle of fluid that will not stop and you have to wear a pad 3.  You have bleeding that is bright red, heavier than spotting--like menstrual bleeding (spotting can be normal in early labor or after a check of your cervix) 4.  You do not feel the baby moving like he/she normally does 

## 2019-12-07 ENCOUNTER — Encounter: Payer: Self-pay | Admitting: Certified Nurse Midwife

## 2019-12-07 ENCOUNTER — Ambulatory Visit (INDEPENDENT_AMBULATORY_CARE_PROVIDER_SITE_OTHER): Payer: PRIVATE HEALTH INSURANCE | Admitting: Certified Nurse Midwife

## 2019-12-07 ENCOUNTER — Other Ambulatory Visit: Payer: Self-pay

## 2019-12-07 ENCOUNTER — Other Ambulatory Visit: Payer: Self-pay | Admitting: Advanced Practice Midwife

## 2019-12-07 VITALS — BP 123/78 | HR 84 | Wt 207.0 lb

## 2019-12-07 DIAGNOSIS — O0990 Supervision of high risk pregnancy, unspecified, unspecified trimester: Secondary | ICD-10-CM

## 2019-12-07 DIAGNOSIS — Z8759 Personal history of other complications of pregnancy, childbirth and the puerperium: Secondary | ICD-10-CM

## 2019-12-07 DIAGNOSIS — O99019 Anemia complicating pregnancy, unspecified trimester: Secondary | ICD-10-CM

## 2019-12-07 DIAGNOSIS — Z3A39 39 weeks gestation of pregnancy: Secondary | ICD-10-CM

## 2019-12-07 DIAGNOSIS — I456 Pre-excitation syndrome: Secondary | ICD-10-CM

## 2019-12-07 NOTE — Patient Instructions (Addendum)
New Induction of Labor Process for [facility]  In Fall 2020 [facility] changed it's process for scheduling inductions of labor to create more induction slots and to make sure patients get COVID-19 testing in advance. After you have been tested you need to quarantine so that you do not get infected after your test. You should not go anywhere after your test except necessary medical appointments.  You have been scheduled for induction of labor on 12/12/19. Although you may have a specific time listed on your After Visit Summary or MyChart, we cannot predict when your room will be available. Please disregard this time. A Labor and Delivery staff member will call you on the day that you are scheduled when your room is available. You will need to arrive within one hour of being called. If you do not arrive within this time frame, the next person on the list will be called in and you will move down the list. You may eat a light meal before coming to the hospital. If you go into labor, think your water has broken, experience bright red bleeding or don't feel your baby moving as much as usual before your induction, please call your Ob/Gyn's office or come to Entrance C, Maternity Assessment Unit for evaluation.  Thank you,  [facility] [employer]

## 2019-12-07 NOTE — Progress Notes (Signed)
   PRENATAL VISIT NOTE  Subjective:  Alicia Allen is a 34 y.o. G2P1001 at [redacted]w[redacted]d being seen today for ongoing prenatal care.  She is currently monitored for the following issues for this high-risk pregnancy and has Supervision of high risk pregnancy, antepartum; Wolff-Parkinson-White (WPW) syndrome; Beta thalassemia (HCC); Anemia during pregnancy; History of gestational hypertension; and [redacted] weeks gestation of pregnancy on their problem list.  Patient reports occasional contractions.  Contractions: Irritability. Vag. Bleeding: None.  Movement: Present. Denies leaking of fluid.   The following portions of the patient's history were reviewed and updated as appropriate: allergies, current medications, past family history, past medical history, past social history, past surgical history and problem list.   Objective:   Vitals:   12/07/19 1412  BP: 123/78  Pulse: 84  Weight: 207 lb (93.9 kg)    Fetal Status: Fetal Heart Rate (bpm): 160 Fundal Height: 35 cm Movement: Present  Presentation: Vertex  General:  Alert, oriented and cooperative. Patient is in no acute distress.  Skin: Skin is warm and dry. No rash noted.   Cardiovascular: Normal heart rate noted  Respiratory: Normal respiratory effort, no problems with respiration noted  Abdomen: Soft, gravid, appropriate for gestational age.  Pain/Pressure: Present     Pelvic: Cervical exam performed in the presence of a chaperone Dilation: Fingertip Effacement (%): Thick Station: -3  Extremities: Normal range of motion.  Edema: Trace  Mental Status: Normal mood and affect. Normal behavior. Normal judgment and thought content.   Assessment and Plan:  Pregnancy: G2P1001 at [redacted]w[redacted]d 1. [redacted] weeks gestation of pregnancy  2. Supervision of high risk pregnancy, antepartum - patient doing well, reports she is tired and ready to have baby girl  - routine prenatal care - educated and discussed IOL with patient, patient reports that last week was told she  could have induction on due date but was not scheduled  - IOL scheduled for 11/7 - orders for admission placed  - Educated and discussed with patient process of induction using different methods of cytotec, FB and pitocin - patient reports that with last pregnancy and induction patient only needed FB in order to go into labor   3. History of gestational hypertension - BP stable   4. Anemia during pregnancy  5. Wolff-Parkinson-White (WPW) syndrome - cleared by cardiology, echo was performed on 6/25 - telemetry during labor   Term labor symptoms and general obstetric precautions including but not limited to vaginal bleeding, contractions, leaking of fluid and fetal movement were reviewed in detail with the patient. Please refer to After Visit Summary for other counseling recommendations.   Return in about 5 weeks (around 01/10/2020) for POSTPARTUM.  Future Appointments  Date Time Provider Department Center  12/12/2019  8:00 AM [facility] SCHED ROOM [facility] None    Sharyon Cable, CNM

## 2019-12-09 ENCOUNTER — Telehealth (HOSPITAL_COMMUNITY): Payer: Self-pay | Admitting: *Deleted

## 2019-12-09 ENCOUNTER — Encounter: Payer: Self-pay | Admitting: General Practice

## 2019-12-09 ENCOUNTER — Encounter (HOSPITAL_COMMUNITY): Payer: Self-pay | Admitting: *Deleted

## 2019-12-09 NOTE — Telephone Encounter (Signed)
Preadmission screen  

## 2019-12-11 ENCOUNTER — Other Ambulatory Visit (HOSPITAL_COMMUNITY)
Admission: RE | Admit: 2019-12-11 | Discharge: 2019-12-11 | Disposition: A | Discharge status: Home / Self Care | Payer: PRIVATE HEALTH INSURANCE | Source: Ambulatory Visit | Attending: Family Medicine | Admitting: Family Medicine

## 2019-12-11 DIAGNOSIS — Z01812 Encounter for preprocedural laboratory examination: Secondary | ICD-10-CM | POA: Insufficient documentation

## 2019-12-11 DIAGNOSIS — Z20822 Contact with and (suspected) exposure to covid-19: Secondary | ICD-10-CM | POA: Insufficient documentation

## 2019-12-11 LAB — SARS CORONAVIRUS 2 (TAT 6-24 HRS): SARS Coronavirus 2: NEGATIVE

## 2019-12-12 ENCOUNTER — Encounter (HOSPITAL_COMMUNITY): Payer: Self-pay | Admitting: Family Medicine

## 2019-12-12 ENCOUNTER — Inpatient Hospital Stay (HOSPITAL_COMMUNITY): Payer: PRIVATE HEALTH INSURANCE

## 2019-12-12 ENCOUNTER — Inpatient Hospital Stay (HOSPITAL_COMMUNITY)
Admission: AD | Admit: 2019-12-12 | Discharge: 2019-12-14 | DRG: 807 | Disposition: A | Discharge status: Home / Self Care | Payer: PRIVATE HEALTH INSURANCE | Attending: Obstetrics and Gynecology | Admitting: Obstetrics and Gynecology

## 2019-12-12 ENCOUNTER — Other Ambulatory Visit: Payer: Self-pay

## 2019-12-12 DIAGNOSIS — O9902 Anemia complicating childbirth: Secondary | ICD-10-CM | POA: Diagnosis present

## 2019-12-12 DIAGNOSIS — I456 Pre-excitation syndrome: Secondary | ICD-10-CM | POA: Diagnosis present

## 2019-12-12 DIAGNOSIS — Z20822 Contact with and (suspected) exposure to covid-19: Secondary | ICD-10-CM | POA: Diagnosis present

## 2019-12-12 DIAGNOSIS — O134 Gestational [pregnancy-induced] hypertension without significant proteinuria, complicating childbirth: Principal | ICD-10-CM | POA: Diagnosis present

## 2019-12-12 DIAGNOSIS — Z3A40 40 weeks gestation of pregnancy: Secondary | ICD-10-CM

## 2019-12-12 DIAGNOSIS — O99019 Anemia complicating pregnancy, unspecified trimester: Secondary | ICD-10-CM | POA: Diagnosis present

## 2019-12-12 DIAGNOSIS — D509 Iron deficiency anemia, unspecified: Secondary | ICD-10-CM | POA: Diagnosis present

## 2019-12-12 DIAGNOSIS — O9952 Diseases of the respiratory system complicating childbirth: Secondary | ICD-10-CM | POA: Diagnosis present

## 2019-12-12 DIAGNOSIS — Z87891 Personal history of nicotine dependence: Secondary | ICD-10-CM | POA: Diagnosis not present

## 2019-12-12 DIAGNOSIS — O480 Post-term pregnancy: Secondary | ICD-10-CM | POA: Diagnosis not present

## 2019-12-12 DIAGNOSIS — E669 Obesity, unspecified: Secondary | ICD-10-CM | POA: Diagnosis present

## 2019-12-12 DIAGNOSIS — O9942 Diseases of the circulatory system complicating childbirth: Secondary | ICD-10-CM | POA: Diagnosis present

## 2019-12-12 DIAGNOSIS — O0990 Supervision of high risk pregnancy, unspecified, unspecified trimester: Secondary | ICD-10-CM

## 2019-12-12 DIAGNOSIS — Z8759 Personal history of other complications of pregnancy, childbirth and the puerperium: Secondary | ICD-10-CM

## 2019-12-12 DIAGNOSIS — Z3490 Encounter for supervision of normal pregnancy, unspecified, unspecified trimester: Secondary | ICD-10-CM | POA: Diagnosis present

## 2019-12-12 DIAGNOSIS — O99214 Obesity complicating childbirth: Secondary | ICD-10-CM | POA: Diagnosis present

## 2019-12-12 DIAGNOSIS — O26893 Other specified pregnancy related conditions, third trimester: Secondary | ICD-10-CM | POA: Diagnosis present

## 2019-12-12 DIAGNOSIS — D561 Beta thalassemia: Secondary | ICD-10-CM | POA: Diagnosis present

## 2019-12-12 DIAGNOSIS — J45909 Unspecified asthma, uncomplicated: Secondary | ICD-10-CM | POA: Diagnosis present

## 2019-12-12 LAB — TYPE AND SCREEN
ABO/RH(D): B POS
Antibody Screen: NEGATIVE

## 2019-12-12 LAB — CBC
HCT: 34.3 % — ABNORMAL LOW (ref 36.0–46.0)
Hemoglobin: 10.8 g/dL — ABNORMAL LOW (ref 12.0–15.0)
MCH: 20.4 pg — ABNORMAL LOW (ref 26.0–34.0)
MCHC: 31.5 g/dL (ref 30.0–36.0)
MCV: 64.8 fL — ABNORMAL LOW (ref 80.0–100.0)
Platelets: 240 10*3/uL (ref 150–400)
RBC: 5.29 MIL/uL — ABNORMAL HIGH (ref 3.87–5.11)
RDW: 15.3 % (ref 11.5–15.5)
WBC: 8.7 10*3/uL (ref 4.0–10.5)
nRBC: 0 % (ref 0.0–0.2)

## 2019-12-12 LAB — RPR: RPR Ser Ql: NONREACTIVE

## 2019-12-12 MED ORDER — FENTANYL CITRATE (PF) 100 MCG/2ML IJ SOLN
INTRAMUSCULAR | Status: AC
  Administered 2019-12-12: 100 ug via INTRAVENOUS
  Filled 2019-12-12: qty 2

## 2019-12-12 MED ORDER — OXYTOCIN-SODIUM CHLORIDE 30-0.9 UT/500ML-% IV SOLN
2.5000 [IU]/h | INTRAVENOUS | Status: DC
  Filled 2019-12-12: qty 500

## 2019-12-12 MED ORDER — FENTANYL CITRATE (PF) 100 MCG/2ML IJ SOLN
100.0000 ug | INTRAMUSCULAR | Status: DC | PRN
  Administered 2019-12-12 (×2): 100 ug via INTRAVENOUS
  Filled 2019-12-12 (×2): qty 2

## 2019-12-12 MED ORDER — LACTATED RINGERS IV SOLN: INTRAVENOUS | Status: DC

## 2019-12-12 MED ORDER — ONDANSETRON HCL 4 MG/2ML IJ SOLN: 4.0000 mg | Freq: Four times a day (QID) | INTRAMUSCULAR | Status: DC | PRN

## 2019-12-12 MED ORDER — MISOPROSTOL 50MCG HALF TABLET: 50.0000 ug | ORAL_TABLET | ORAL | Status: DC

## 2019-12-12 MED ORDER — SOD CITRATE-CITRIC ACID 500-334 MG/5ML PO SOLN: 30.0000 mL | ORAL | Status: DC | PRN

## 2019-12-12 MED ORDER — MISOPROSTOL 25 MCG QUARTER TABLET
25.0000 ug | ORAL_TABLET | ORAL | Status: DC | PRN
  Administered 2019-12-12 (×3): 25 ug via VAGINAL
  Filled 2019-12-12 (×3): qty 1

## 2019-12-12 MED ORDER — CALCIUM CARBONATE ANTACID 500 MG PO CHEW: 400.0000 mg | CHEWABLE_TABLET | ORAL | Status: DC | PRN

## 2019-12-12 MED ORDER — ACETAMINOPHEN 325 MG PO TABS
650.0000 mg | ORAL_TABLET | ORAL | Status: DC | PRN
  Administered 2019-12-12: 650 mg via ORAL
  Filled 2019-12-12: qty 2

## 2019-12-12 MED ORDER — LIDOCAINE HCL (PF) 1 % IJ SOLN: 30.0000 mL | INTRAMUSCULAR | Status: DC | PRN

## 2019-12-12 MED ORDER — LACTATED RINGERS IV SOLN
500.0000 mL | INTRAVENOUS | Status: DC | PRN
  Administered 2019-12-12: 500 mL via INTRAVENOUS

## 2019-12-12 MED ORDER — TERBUTALINE SULFATE 1 MG/ML IJ SOLN: 0.2500 mg | Freq: Once | INTRAMUSCULAR | Status: DC | PRN

## 2019-12-12 MED ORDER — OXYTOCIN BOLUS FROM INFUSION
333.0000 mL | Freq: Once | INTRAVENOUS | Status: AC
  Administered 2019-12-13: 333 mL via INTRAVENOUS

## 2019-12-12 MED ORDER — MISOPROSTOL 50MCG HALF TABLET
ORAL_TABLET | ORAL | Status: AC
  Administered 2019-12-12: 50 ug via BUCCAL
  Filled 2019-12-12: qty 1

## 2019-12-12 NOTE — Progress Notes (Signed)
Telemetry monitor applied.

## 2019-12-12 NOTE — H&P (Signed)
HPI: Alicia Allen is a 34 y.o. year old G32P1001 female at [redacted]w[redacted]d weeks gestation who presents to L&D for elective IOL. Denies LOF, VB. Active fetus.   Hx WPW. Sees Dr. Jens Som at [facility]. 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 Some Sx in 2012 then but none recently. Nml Echo and Zio patch monitoring in 07/2019.   Also has Beta Thalassemia. On PO Iron. Seen by Dr. Candise Che w/ [facility].    OB History    Gravida  2   Para  1   Term  1   Preterm      AB      Living  1     SAB      TAB      Ectopic      Multiple      Live Births  1          Past Medical History:  Diagnosis Date  . Asthma   . Beta thalassemia (HCC)   . Complication of anesthesia   . Gestational hypertension   . History of Wolff-Parkinson-White syndrome   . Oral herpes simplex infection    Valtrex as needed. Never had genital lesions.  Marland Kitchen PONV (postoperative nausea and vomiting)    Past Surgical History:  Procedure Laterality Date  . BUNIONECTOMY    . CARDIAC SURGERY    . LAPAROSCOPY    . patent artero    . TONSILLECTOMY     Family History: family history includes Breast cancer in her maternal grandmother and paternal grandmother; Diabetes in her maternal grandfather; Fibromyalgia in her paternal grandmother; Heart attack in her maternal grandfather; Heart disease in her father and paternal grandfather; Huntington's disease in her paternal grandmother; Hypercholesterolemia in her maternal grandmother; Hypertension in her maternal grandmother; Multiple sclerosis in her father; Skin cancer in her paternal grandmother; Thalassemia in her mother. Social History:  reports that she has quit smoking. She has never used smokeless tobacco. She reports current alcohol use. She reports that she does not use drugs.     Maternal Diabetes: No Genetic Screening: Normal Maternal Ultrasounds/Referrals: Normal Fetal Ultrasounds or other Referrals:  None Maternal Substance Abuse:   No Significant Maternal Medications:  None Significant Maternal Lab Results:  Group B Strep negative Other Comments:  None  Review of Systems Maternal Medical History:  Reason for admission: Elective  Contractions: Frequency: irregular.   Perceived severity is mild.    Fetal activity: Perceived fetal activity is normal.    Prenatal complications: No bleeding or PIH.   Prenatal Complications - Diabetes: none.    Dilation: 1 (external cl internal) Effacement (%): Thick Station: -3 Exam by:: V Thaddeaus Monica CNM Blood pressure 116/80, pulse 75, temperature (!) 97.5 F (36.4 C), temperature source Oral, resp. rate 19, height 5\' 3"  (1.6 m), weight 95.3 kg, last menstrual period 03/07/2019. Maternal Exam:  Uterine Assessment: Contraction strength is mild.  Contraction frequency is irregular.   Abdomen: Patient reports no abdominal tenderness. Estimated fetal weight is 8 lb.   Fetal presentation: vertex  Introitus: Normal vulva. Normal vagina.  Pelvis: adequate for delivery.   Cervix: Cervix evaluated by digital exam.     Fetal Exam Fetal Monitor Review: Mode: ultrasound.   Baseline rate: 130.  Variability: moderate (6-25 bpm).   Pattern: accelerations present and no decelerations.    Fetal State Assessment: Category I - tracings are normal.     Physical Exam Constitutional:      General: She  is not in acute distress.    Appearance: Normal appearance. She is not ill-appearing.  Eyes:     Conjunctiva/sclera: Conjunctivae normal.  Cardiovascular:     Rate and Rhythm: Normal rate and regular rhythm.     Heart sounds: Murmur (1/6 systolic) heard.   Pulmonary:     Effort: Pulmonary effort is normal. No respiratory distress.     Breath sounds: Normal breath sounds.  Abdominal:     General: There is no distension.     Tenderness: There is no abdominal tenderness.  Genitourinary:    General: Normal vulva.  Skin:    General: Skin is warm and dry.  Neurological:     Mental  Status: She is alert and oriented to person, place, and time.     Deep Tendon Reflexes: Reflexes normal.  Psychiatric:        Mood and Affect: Mood normal.     Prenatal labs: ABO, Rh: --/--/B POS (11/07 0810) Antibody: NEG (11/07 0810) Rubella: 3.24 (04/09 1101) RPR: NON REACTIVE (11/07 0809)  HBsAg: NON-REACTIVE (04/09 1101)  HIV: NON-REACTIVE (08/13 0901)  GBS:   Neg  Assessment: 1. Labor: Elective IOL 2. Fetal Wellbeing: Category I  3. Pain Control: Comfort measures 4. GBS: Neg 5. 40.0 week IUP   Plan:  1. Admit to BS per consult with MD 2. Routine L&D orders 3. Analgesia/anesthesia PRN  4. Cytotec. Plan Foley when able. Pt hopes to avoid pitocin of possible, but will consent if other methods of induction have been exhausted.  5. Telemetry monitoring   [place] 12/12/2019, 4:15 PM

## 2019-12-12 NOTE — Progress Notes (Addendum)
Labor Progress Note Alicia Allen is a 34 y.o. G2P1001 at [redacted]w[redacted]d presented for IOL 2/2 gHTN. PMHx WPW, beta thal, anemia (admission Hgb 10.8).   S: Pt was found sitting up in bed with husband and friend at the bedside. She reports a new headache located at the back of her head. PMHx migraines, but does not believe this is one due to no aura as is normal for her. Also reports some epigastric pain and chest pain with SOB about 45 minutes after eating, as has been normal for her third trimester. No n/v. This apparently has been happening throughout her third trimester and has been evaluated by cardiology.   O:  BP 121/76   Pulse 77   Temp 98.3 F (36.8 C) (Oral)   Resp 16   Ht 5\' 3"  (1.6 m)   Wt 95.3 kg   LMP 03/07/2019   BMI 37.20 kg/m  EFM:  Baseline 125 bpm / moderate variability / accels present, no decels Toco: irregular  CVE: Dilation: 1 (external cl internal) Effacement (%): Thick Station: -3 Presentation: Vertex Exam by:: [email or web address] CNM   A&P: 34 y.o. G2P1001 [redacted]w[redacted]d presented for IOL 2/2 gHTN. PMHx WPW, beta thal, anemia (admission Hgb 10.8). #Labor: Progressing well. S/p cytotec x2, last at 1435. Next due for eval (with hopeful FB placement) at 1830.  #Pain: IV fentanyl prn #FWB: Cat 1 #GBS negative #WPW: Cardiologist recommends staying on telemetry during labor #Anemia: admission Hgb 10.8, plan for PPD#1 CBC  [redacted]w[redacted]d, MD 3:11 PM

## 2019-12-12 NOTE — Progress Notes (Signed)
Attempted placement of m&f monitor without success

## 2019-12-12 NOTE — Progress Notes (Signed)
Angeliki Mates is a 34 y.o. G2P1001 at [redacted]w[redacted]d.  Subjective: Incresaed cramping w/ UC's.   Objective: BP 125/72   Pulse 87   Temp 97.9 F (36.6 C) (Axillary)   Resp 20   Ht 5\' 3"  (1.6 m)   Wt 95.3 kg   LMP 03/07/2019   BMI 37.20 kg/m    FHT:  FHR: 140 bpm, variability: mod,  accelerations:  15x15,  decelerations:  none UC:   Q 2-3 minutes, moderate Dilation: 3 (3 external; 1 internal) Effacement (%): Thick Station: -3 Presentation: Vertex Exam by:: V Lequisha Cammack CNM  Foley bulb placed w/out difficulty. Cytotec placed.   Labs: NA  Assessment / Plan: [redacted]w[redacted]d week IUP Labor: Early/IOL Fetal Wellbeing:  Category I Pain Control:  Comfort measures Anticipated MOD:  SVD  [redacted]w[redacted]d, CNM 12/12/2019 7:28 PM

## 2019-12-12 NOTE — Progress Notes (Signed)
LABOR PROGRESS NOTE  Alicia Allen is a 34 y.o. G2P1001 at [redacted]w[redacted]d  admitted for elective IOL   Subjective: Patient breathing through contractions, getting IV pain medication at this time.   Objective: BP 125/84   Pulse 77   Temp 98.2 F (36.8 C) (Oral)   Resp 20   Ht 5\' 3"  (1.6 m)   Wt 95.3 kg   LMP 03/07/2019   BMI 37.20 kg/m  or  Vitals:   12/12/19 1600 12/12/19 1704 12/12/19 1900 12/12/19 1935  BP: 116/80 125/72  125/84  Pulse: 75 72 87 77  Resp: 19 20    Temp: (!) 97.5 F (36.4 C) 97.9 F (36.6 C)  98.2 F (36.8 C)  TempSrc: Oral Axillary  Oral  Weight:      Height:        FB in place  Dilation: 3 (3 external; 1 internal) Effacement (%): Thick Station: -3 Presentation: Vertex Exam by:: [email or web address] CNM FHT: baseline rate 140, moderate varibility, +accel, no decel Toco: 2 minutes   Labs: Lab Results  Component Value Date   WBC 8.7 12/12/2019   HGB 10.8 (L) 12/12/2019   HCT 34.3 (L) 12/12/2019   MCV 64.8 (L) 12/12/2019   PLT 240 12/12/2019    Patient Active Problem List   Diagnosis Date Noted  . Encounter for induction of labor 12/12/2019  . [redacted] weeks gestation of pregnancy 11/24/2019  . History of gestational hypertension 07/29/2019  . Anemia during pregnancy 05/19/2019  . Supervision of high risk pregnancy, antepartum 05/14/2019  . Wolff-Parkinson-White (WPW) syndrome 05/14/2019  . Beta thalassemia (HCC) 05/14/2019    Assessment / Plan: 33 y.o. G2P1001 at [redacted]w[redacted]d here for elective IOL   Labor: Cytotec and FB for IOL  Fetal Wellbeing:  Cat I  Pain Control:  IV Fentanyl  Anticipated MOD:  SVD  [redacted]w[redacted]d, CNM 12/12/2019, 8:46 PM

## 2019-12-12 NOTE — Progress Notes (Signed)
K pad to back

## 2019-12-13 ENCOUNTER — Encounter (HOSPITAL_COMMUNITY): Payer: Self-pay | Admitting: Family Medicine

## 2019-12-13 ENCOUNTER — Inpatient Hospital Stay (HOSPITAL_COMMUNITY): Payer: PRIVATE HEALTH INSURANCE | Admitting: Anesthesiology

## 2019-12-13 DIAGNOSIS — O480 Post-term pregnancy: Secondary | ICD-10-CM

## 2019-12-13 DIAGNOSIS — Z3A40 40 weeks gestation of pregnancy: Secondary | ICD-10-CM

## 2019-12-13 MED ORDER — EPHEDRINE 5 MG/ML INJ: 10.0000 mg | INTRAVENOUS | Status: DC | PRN

## 2019-12-13 MED ORDER — TETANUS-DIPHTH-ACELL PERTUSSIS 5-2.5-18.5 LF-MCG/0.5 IM SUSY: 0.5000 mL | PREFILLED_SYRINGE | Freq: Once | INTRAMUSCULAR | Status: DC

## 2019-12-13 MED ORDER — LACTATED RINGERS IV SOLN
500.0000 mL | Freq: Once | INTRAVENOUS | Status: AC
  Administered 2019-12-13: 500 mL via INTRAVENOUS

## 2019-12-13 MED ORDER — FENTANYL-BUPIVACAINE-NACL 0.5-0.125-0.9 MG/250ML-% EP SOLN
EPIDURAL | Status: AC
  Filled 2019-12-13: qty 250

## 2019-12-13 MED ORDER — FENTANYL-BUPIVACAINE-NACL 0.5-0.125-0.9 MG/250ML-% EP SOLN: 12.0000 mL/h | EPIDURAL | Status: DC | PRN

## 2019-12-13 MED ORDER — DIPHENHYDRAMINE HCL 25 MG PO CAPS: 25.0000 mg | ORAL_CAPSULE | Freq: Four times a day (QID) | ORAL | Status: DC | PRN

## 2019-12-13 MED ORDER — SIMETHICONE 80 MG PO CHEW: 80.0000 mg | CHEWABLE_TABLET | ORAL | Status: DC | PRN

## 2019-12-13 MED ORDER — PHENYLEPHRINE 40 MCG/ML (10ML) SYRINGE FOR IV PUSH (FOR BLOOD PRESSURE SUPPORT): 80.0000 ug | PREFILLED_SYRINGE | INTRAVENOUS | Status: DC | PRN

## 2019-12-13 MED ORDER — WITCH HAZEL-GLYCERIN EX PADS: 1.0000 "application " | MEDICATED_PAD | CUTANEOUS | Status: DC | PRN

## 2019-12-13 MED ORDER — DIPHENHYDRAMINE HCL 50 MG/ML IJ SOLN: 12.5000 mg | INTRAMUSCULAR | Status: DC | PRN

## 2019-12-13 MED ORDER — ONDANSETRON HCL 4 MG/2ML IJ SOLN: 4.0000 mg | INTRAMUSCULAR | Status: DC | PRN

## 2019-12-13 MED ORDER — SENNOSIDES-DOCUSATE SODIUM 8.6-50 MG PO TABS
2.0000 | ORAL_TABLET | ORAL | Status: DC
  Administered 2019-12-13: 2 via ORAL
  Filled 2019-12-13: qty 2

## 2019-12-13 MED ORDER — LACTATED RINGERS AMNIOINFUSION: INTRAVENOUS | Status: DC

## 2019-12-13 MED ORDER — MISOPROSTOL 50MCG HALF TABLET: 50.0000 ug | ORAL_TABLET | ORAL | Status: DC | PRN

## 2019-12-13 MED ORDER — PRENATAL MULTIVITAMIN CH
1.0000 | ORAL_TABLET | Freq: Every day | ORAL | Status: DC
  Administered 2019-12-13 – 2019-12-14 (×2): 1 via ORAL
  Filled 2019-12-13 (×2): qty 1

## 2019-12-13 MED ORDER — DIBUCAINE (PERIANAL) 1 % EX OINT: 1.0000 "application " | TOPICAL_OINTMENT | CUTANEOUS | Status: DC | PRN

## 2019-12-13 MED ORDER — IBUPROFEN 600 MG PO TABS
600.0000 mg | ORAL_TABLET | Freq: Four times a day (QID) | ORAL | Status: DC
  Administered 2019-12-13 – 2019-12-14 (×6): 600 mg via ORAL
  Filled 2019-12-13 (×6): qty 1

## 2019-12-13 MED ORDER — LIDOCAINE HCL (PF) 1 % IJ SOLN
INTRAMUSCULAR | Status: DC | PRN
  Administered 2019-12-13 (×2): 4 mL via EPIDURAL

## 2019-12-13 MED ORDER — ACETAMINOPHEN 325 MG PO TABS
650.0000 mg | ORAL_TABLET | ORAL | Status: DC | PRN
  Administered 2019-12-14: 650 mg via ORAL
  Filled 2019-12-13: qty 2

## 2019-12-13 MED ORDER — BENZOCAINE-MENTHOL 20-0.5 % EX AERO
1.0000 "application " | INHALATION_SPRAY | CUTANEOUS | Status: DC | PRN
  Administered 2019-12-13: 1 via TOPICAL
  Filled 2019-12-13: qty 56

## 2019-12-13 MED ORDER — COCONUT OIL OIL
1.0000 "application " | TOPICAL_OIL | Status: DC | PRN
  Administered 2019-12-14: 1 via TOPICAL

## 2019-12-13 MED ORDER — SODIUM CHLORIDE (PF) 0.9 % IJ SOLN
INTRAMUSCULAR | Status: DC | PRN
  Administered 2019-12-13: 12 mL/h via EPIDURAL

## 2019-12-13 MED ORDER — PHENYLEPHRINE 40 MCG/ML (10ML) SYRINGE FOR IV PUSH (FOR BLOOD PRESSURE SUPPORT)
80.0000 ug | PREFILLED_SYRINGE | INTRAVENOUS | Status: DC | PRN
  Filled 2019-12-13: qty 10

## 2019-12-13 MED ORDER — ZOLPIDEM TARTRATE 5 MG PO TABS: 5.0000 mg | ORAL_TABLET | Freq: Every evening | ORAL | Status: DC | PRN

## 2019-12-13 MED ORDER — ONDANSETRON HCL 4 MG PO TABS: 4.0000 mg | ORAL_TABLET | ORAL | Status: DC | PRN

## 2019-12-13 NOTE — Discharge Summary (Signed)
Postpartum Discharge Summary  Date of Service updated 12/14/19     Patient Name: Alicia Allen DOB: 12/28/1985 MRN: [id number]  Date of admission: 12/12/2019 Delivery date:12/13/2019  Delivering provider: Lajean Manes  Date of discharge: 12/14/2019  Admitting diagnosis: Encounter for induction of labor [Z34.90] Intrauterine pregnancy: [redacted]w[redacted]d    Secondary diagnosis:  Active Problems:   Supervision of high risk pregnancy, antepartum   Wolff-Parkinson-White (WPW) syndrome   Beta thalassemia (HCC)   Anemia during pregnancy   History of gestational hypertension   Encounter for induction of labor   SVD (spontaneous vaginal delivery)  Additional problems: none    Discharge diagnosis: Term Pregnancy Delivered                                              Post partum procedures:none Augmentation: Cytotec and IP Foley Complications: None  Hospital course: Induction of Labor With Vaginal Delivery   34y.o. yo G2P1001 at 440w1das admitted to the hospital 12/12/2019 for induction of labor.  Indication for induction: Elective.  Patient had an uncomplicated labor course as follows: Membrane Rupture Time/Date: 12:13 AM ,12/13/2019   Delivery Method:Vaginal, Spontaneous  Episiotomy: None  Lacerations:  1st degree  Details of delivery can be found in separate delivery note.  Patient had a routine postpartum course. Patient is discharged home 12/14/19.  Newborn Data: Birth date:12/13/2019  Birth time:4:08 AM  Gender:Female  Living status:Living  Apgars:9 ,9  Weight:3393 g   Magnesium Sulfate received: No BMZ received: No Rhophylac:N/A MMR:N/A T-DaP:Given prenatally Flu: No Transfusion:No  Physical exam  Vitals:   12/13/19 0735 12/13/19 1145 12/13/19 1540 12/13/19 1945  BP: 110/74 119/70 122/72 124/74  Pulse:  97 83 77  Resp: '16 16 18 17  ' Temp: 98.8 F (37.1 C) (!) 97.5 F (36.4 C) 98.5 F (36.9 C) 97.9 F (36.6 C)  TempSrc: Oral Oral Oral Oral  SpO2:  98%  99%   Weight:      Height:       General: alert, cooperative and no distress Lochia: appropriate Uterine Fundus: firm Incision: N/A DVT Evaluation: No evidence of DVT seen on physical exam. Labs: Lab Results  Component Value Date   WBC 8.7 12/12/2019   HGB 10.8 (L) 12/12/2019   HCT 34.3 (L) 12/12/2019   MCV 64.8 (L) 12/12/2019   PLT 240 12/12/2019   CMP Latest Ref Rng & Units 11/10/2019  Glucose 65 - 99 mg/dL 75  BUN 7 - 25 mg/dL 6(L)  Creatinine 0.50 - 1.10 mg/dL 0.46(L)  Sodium 135 - 146 mmol/L 135  Potassium 3.5 - 5.3 mmol/L 4.1  Chloride 98 - 110 mmol/L 107  CO2 20 - 32 mmol/L 21  Calcium 8.6 - 10.2 mg/dL 8.8  Total Protein 6.1 - 8.1 g/dL 5.7(L)  Total Bilirubin 0.2 - 1.2 mg/dL 0.5  Alkaline Phos 38 - 126 U/L -  AST 10 - 30 U/L 13  ALT 6 - 29 U/L 9   Edinburgh Score: Edinburgh Postnatal Depression Scale Screening Tool 12/13/2019  I have been able to laugh and see the funny side of things. 0  I have looked forward with enjoyment to things. 0  I have blamed myself unnecessarily when things went wrong. 1  I have been anxious or worried for no good reason. 1  I have felt scared or panicky for no good  reason. 0  Things have been getting on top of me. 0  I have been so unhappy that I have had difficulty sleeping. 0  I have felt sad or miserable. 0  I have been so unhappy that I have been crying. 0  The thought of harming myself has occurred to me. 0  Edinburgh Postnatal Depression Scale Total 2     After visit meds:  Allergies as of 12/14/2019      Reactions   Metoclopramide Anxiety   Severe panic attacks   Severe panic attacks        Medication List    STOP taking these medications   aspirin EC 81 MG tablet   folic acid 161 MCG tablet Commonly known as: FOLVITE   iron polysaccharides 150 MG capsule Commonly known as: NIFEREX     TAKE these medications   acetaminophen 325 MG tablet Commonly known as: Tylenol Take 2 tablets (650 mg total) by mouth every 4  (four) hours as needed (for pain scale < 4).   albuterol 108 (90 Base) MCG/ACT inhaler Commonly known as: VENTOLIN HFA Inhale 1 puff into the lungs every 6 (six) hours as needed for wheezing.   coconut oil Oil Apply 1 application topically as needed.   ibuprofen 600 MG tablet Commonly known as: ADVIL Take 1 tablet (600 mg total) by mouth every 6 (six) hours.   norethindrone 0.35 MG tablet Commonly known as: Ortho Micronor Take 1 tablet (0.35 mg total) by mouth daily.   PRENATAL VITAMIN PO Take by mouth.        Discharge home in stable condition Infant Feeding: Breast Infant Disposition:home with mother Discharge instruction: per After Visit Summary and Postpartum booklet. Activity: Advance as tolerated. Pelvic rest for 6 weeks.  Diet: routine diet Future Appointments:No future appointments. Follow up Visit:   Please schedule this patient for a In person postpartum visit in 4 weeks with the following provider: Any provider. Additional Postpartum F/U:none  Low risk pregnancy complicated by: none Delivery mode:  Vaginal, Spontaneous  Anticipated Birth Control:  POPs   10/10/452 Arrie Senate, MD

## 2019-12-13 NOTE — Anesthesia Postprocedure Evaluation (Signed)
Anesthesia Post Note  Patient: Alicia Allen  Procedure(s) Performed: AN AD HOC LABOR EPIDURAL     Patient location during evaluation: Mother Baby Anesthesia Type: Epidural Level of consciousness: awake and alert Pain management: pain level controlled Vital Signs Assessment: post-procedure vital signs reviewed and stable Respiratory status: spontaneous breathing Cardiovascular status: stable Postop Assessment: no apparent nausea or vomiting and able to ambulate Anesthetic complications: no   No complications documented.  Last Vitals:  Vitals:   12/13/19 0735 12/13/19 1145  BP: 110/74 119/70  Pulse:  97  Resp: 16 16  Temp: 37.1 C (!) 36.4 C  SpO2:  98%    Last Pain:  Vitals:   12/13/19 1145  TempSrc: Oral  PainSc: 0-No pain   Pain Goal:                   Edison Pace

## 2019-12-13 NOTE — Progress Notes (Signed)
Patient comfortable with epidural, feeling intermittent pressure  Variable during each contraction noted on monitor - CNM at bedside  Vitals:   12/13/19 0148 12/13/19 0200 12/13/19 0208 12/13/19 0231  BP:  (!) 92/57  129/75  Pulse: 90 (!) 111 (!) 120 80  Resp:  17  16  Temp:      TempSrc:      SpO2:      Weight:      Height:       Dilation: 7.5 Effacement (%): 80 Cervical Position: Posterior Station: -1 Presentation: Vertex Exam by:: Lanice Shirts, CNM IUPC placed and amnioinfusion initiated   Variables resolved after initiation  FHR 140/min-mod variability/ no accelerations  CAT II tracing   Plans for SVD   Sharyon Cable, CNM 12/13/19, 2:37 AM

## 2019-12-13 NOTE — Lactation Note (Signed)
This note was copied from a baby's chart. Lactation Consultation Note  Patient Name: Alicia Allen [id number] Date: 12/13/2019   Mother resting and asked for lactation to come back later today.  Lactation will follow up.     Maternal Data    Feeding Feeding Type: Breast Fed  LATCH Score                   Interventions    Lactation Tools Discussed/Used     Consult Status      Hardie Pulley 12/13/2019, 11:31 AM

## 2019-12-13 NOTE — Anesthesia Procedure Notes (Signed)

## 2019-12-13 NOTE — Anesthesia Preprocedure Evaluation (Addendum)
Anesthesia Evaluation  Patient identified by MRN, date of birth, ID band Patient awake    Reviewed: Allergy & Precautions, Patient's Chart, lab work & pertinent test results  History of Anesthesia Complications (+) PONV and history of anesthetic complications  Airway Mallampati: II  TM Distance: >3 FB Neck ROM: Full    Dental no notable dental hx. (+) Teeth Intact   Pulmonary asthma , former smoker,    Pulmonary exam normal breath sounds clear to auscultation       Cardiovascular hypertension, Normal cardiovascular exam+ dysrhythmias  Rhythm:Regular Rate:Normal  Hx/o WPW S/P ablation   Neuro/Psych negative neurological ROS  negative psych ROS   GI/Hepatic Neg liver ROS, GERD  ,  Endo/Other  Obesity  Renal/GU negative Renal ROS  negative genitourinary   Musculoskeletal negative musculoskeletal ROS (+)   Abdominal (+) + obese,   Peds  Hematology  (+) anemia , Beta Thalassemia   Anesthesia Other Findings   Reproductive/Obstetrics (+) Pregnancy                            Anesthesia Physical Anesthesia Plan  ASA: II  Anesthesia Plan: Epidural   Post-op Pain Management:    Induction:   PONV Risk Score and Plan:   Airway Management Planned: Natural Airway  Additional Equipment:   Intra-op Plan:   Post-operative Plan:   Informed Consent: I have reviewed the patients History and Physical, chart, labs and discussed the procedure including the risks, benefits and alternatives for the proposed anesthesia with the patient or authorized representative who has indicated his/her understanding and acceptance.       Plan Discussed with: Anesthesiologist  Anesthesia Plan Comments:         Anesthesia Quick Evaluation

## 2019-12-13 NOTE — Lactation Note (Signed)
This note was copied from a baby's chart. Lactation Consultation Note  Patient Name: Girl Calais Svehla [id number] Date: 12/13/2019 Reason for consult: Initial assessment   P2, Baby 10 hours old.  Mother bf her first child for 3 mos with difficulty.  Son had frenotomy. She stopped due to difficulty bf after frenotomy and infant's surgery. She is happy this baby is latching well so far. Noted short anterior lingual frenulum.  Discuss with Ped MD.   Observed latch with guidance and provided mother with manual pump. Suggest asking for DEBP if baby becomes too sleepy at breast or if mother becomes sore. Mom made aware of O/P services, breastfeeding support groups, community resources, and our phone # for post-discharge questions.  Provided frenotomy resource information sheet.      Maternal Data Has patient been taught Hand Expression?: Yes Does the patient have breastfeeding experience prior to this delivery?: Yes  Feeding Feeding Type: Breast Fed  LATCH Score Latch: Grasps breast easily, tongue down, lips flanged, rhythmical sucking.  Audible Swallowing: A few with stimulation  Type of Nipple: Everted at rest and after stimulation  Comfort (Breast/Nipple): Soft / non-tender  Hold (Positioning): Assistance needed to correctly position infant at breast and maintain latch.  LATCH Score: 8  Interventions Interventions: Breast feeding basics reviewed;Assisted with latch;Skin to skin;Hand express;Hand pump  Lactation Tools Discussed/Used     Consult Status Consult Status: Follow-up Date: 12/14/19 Follow-up type: In-patient    Dahlia Byes [facility] 12/13/2019, 2:59 PM

## 2019-12-14 MED ORDER — ACETAMINOPHEN 325 MG PO TABS: 650.0000 mg | ORAL_TABLET | ORAL | Status: AC | PRN

## 2019-12-14 MED ORDER — NORETHINDRONE 0.35 MG PO TABS: 1.0000 | ORAL_TABLET | Freq: Every day | ORAL | 11 refills | Status: DC

## 2019-12-14 MED ORDER — COCONUT OIL OIL
1.0000 | TOPICAL_OIL | 0 refills | Status: DC | PRN
Start: 2019-12-14 — End: 2021-02-22

## 2019-12-14 MED ORDER — IBUPROFEN 600 MG PO TABS: 600.0000 mg | ORAL_TABLET | Freq: Four times a day (QID) | ORAL | 0 refills | Status: AC

## 2019-12-14 NOTE — Lactation Note (Signed)
This note was copied from a baby's chart. Lactation Consultation Note  Patient Name: Alicia Allen [id number] Date: 12/14/2019  P2, 21 hour term female infant. Infant had 2 voids and 2 stools in past 21 hours of life.  Per mom, she feels infant is latching well aware infant has short anterior lingual frenulum which she will discussed with her Pediatrician.  Per mom, most feedings are 15 to 20 minutes in length, mom denies any BF soreness or pain, mom doesn't have any questions or concerns for LC at this time.. Mom will continue to BF infant according to hunger cues, 8 to 12+ times within 24 hours, STS. Mom had infant latched but infant was asleep at breast, per mom, infant had recently finished feeding. LC did not observe latch.   Maternal Data    Feeding    LATCH Score                   Interventions    Lactation Tools Discussed/Used     Consult Status      Alicia Allen 12/14/2019, 1:31 AM

## 2019-12-14 NOTE — Lactation Note (Signed)
This note was copied from a baby's chart. Lactation Consultation Note  Patient Name: Alicia Allen [id number] Date: 12/14/2019   Mom affirmed that feedings are going well. She had no questions or concerns for this LC.   Lurline Hare [facility] 12/14/2019, 12:54 PM

## 2019-12-14 NOTE — Discharge Instructions (Signed)

## 2020-01-11 ENCOUNTER — Other Ambulatory Visit: Payer: Self-pay

## 2020-01-11 ENCOUNTER — Ambulatory Visit (INDEPENDENT_AMBULATORY_CARE_PROVIDER_SITE_OTHER): Payer: PRIVATE HEALTH INSURANCE | Admitting: Advanced Practice Midwife

## 2020-01-11 DIAGNOSIS — Z3009 Encounter for other general counseling and advice on contraception: Secondary | ICD-10-CM

## 2020-01-11 DIAGNOSIS — G479 Sleep disorder, unspecified: Secondary | ICD-10-CM

## 2020-01-11 DIAGNOSIS — O99893 Other specified diseases and conditions complicating puerperium: Secondary | ICD-10-CM

## 2020-01-11 DIAGNOSIS — Z8742 Personal history of other diseases of the female genital tract: Secondary | ICD-10-CM

## 2020-01-11 NOTE — Progress Notes (Signed)
Post Partum Visit Note  Alicia Allen is a 34 y.o. G33P2002 female who presents for a postpartum visit. She is 4 weeks postpartum following a normal spontaneous vaginal delivery.  I have fully reviewed the prenatal and intrapartum course. The delivery was at [redacted]w[redacted]d gestational weeks.  Anesthesia: epidural. Postpartum course has been unremarkable. Baby is doing well. Baby is feeding by breast. Bleeding spotting. Bowel function is normal. Bladder function is normal. Patient not  sexually active yet. Contraception method is OCP (estrogen/progesterone) and oral progesterone-only contraceptive. Postpartum depression screening: negative.   The pregnancy intention screening data noted above was reviewed. Potential methods of contraception were discussed. The patient elected to proceed with Oral Contraceptive.    Edinburgh Postnatal Depression Scale - 01/11/20 0955      Edinburgh Postnatal Depression Scale:  In the Past 7 Days   I have been able to laugh and see the funny side of things. 0    I have looked forward with enjoyment to things. 0    I have blamed myself unnecessarily when things went wrong. 1    I have been anxious or worried for no good reason. 1    I have felt scared or panicky for no good reason. 0    Things have been getting on top of me. 1    I have been so unhappy that I have had difficulty sleeping. 0    I have felt sad or miserable. 0    I have been so unhappy that I have been crying. 0    The thought of harming myself has occurred to me. 0    Edinburgh Postnatal Depression Scale Total 3            The following portions of the patient's history were reviewed and updated as appropriate: allergies, current medications, past family history, past medical history, past social history, past surgical history and problem list.  Review of Systems Pertinent items noted in HPI and remainder of comprehensive ROS otherwise negative.    Objective:  BP 121/82   Pulse 71   Wt 187 lb  (84.8 kg)   BMI 33.13 kg/m    VS reviewed, nursing note reviewed,  Constitutional: well developed, well nourished, no distress HEENT: normocephalic CV: normal rate Pulm/chest wall: normal effort Abdomen: soft Neuro: alert and oriented x 3 Skin: warm, dry Psych: affect normal  Assessment:   1. Postpartum care following vaginal delivery --Doing well, bonding well with baby, good support at home  2. Sleep disturbance --Pt had chronic sleep problems before her pregnancies, questionable sleep apnea due to symptoms, wants to investigate sleep after baby is sleeping through the night. --Pt to start new insurance and PCP, discuss sleep study with PCP  3. General counseling and advice for contraceptive management --Pt with Rx for micronor, Ok to start now.  Discussed contraceptive options. Pt husband is getting a vasectomy but pt has endometriosis and may want hormonal management for that.  She has always avoided estrogen because her grandmother had breast cancer affected by estrogen.  May choose to keep micronor, or try [facility] for more regular menses.  She may also want to try LARC like IUD which may be enough hormones to improve endometriosis, especially if she is amenorrheic.   --F/U appt in 6 months to discuss contraception/endometriosis management  4. Hx of endometriosis --Did not improve after first baby so will see about symptoms in the next few months.  See above for f/u.  Plan:  Essential components of care per ACOG recommendations:  1.  Mood and well being: Patient with negative depression screening today. Reviewed local resources for support.  - Patient does not use tobacco.  - hx of drug use? No   2. Infant care and feeding:  -Patient currently breastmilk feeding? Yes  If breastmilk feeding discussed return to work and pumping. If needed, patient was provided letter for work to allow for every 2-3 hr pumping breaks, and to be granted a private location to express breastmilk  and refrigerated area to store breastmilk. Reviewed importance of draining breast regularly to support lactation. -Social determinants of health (SDOH) reviewed in EPIC. No concerns  3. Sexuality, contraception and birth spacing - Patient does not want a pregnancy in the next year.  Desired family size is 2 children.  - Reviewed forms of contraception in tiered fashion. Patient desired oral progesterone-only contraceptive today.  Pt husband planning vasectomy. - Discussed birth spacing of 18 months  4. Sleep and fatigue -Encouraged family/partner/community support of 4 hrs of uninterrupted sleep to help with mood and fatigue  5. Physical Recovery  - Discussed patients delivery without complication - Patient had a first degree laceration, perineal healing reviewed. Patient expressed understanding - Patient has urinary incontinence? No  Discussed PT, pt had incontinence in pregnancy but now resolved. Pt to notify office if returns. - Patient is safe to resume physical and sexual activity  6.  Health Maintenance - Last pap smear done 05/14/2019 and was normal with negative HPV.   7.Chronic Disease --See sleep disturbance above - PCP follow up  Sharen Counter, CNM [facility] [employer], [facility]

## 2020-06-08 ENCOUNTER — Telehealth: Payer: Self-pay | Admitting: *Deleted

## 2020-06-08 NOTE — Telephone Encounter (Signed)
Left patient a message to call and schedule 6 month F/U appointment with Misty Stanley around 07/11/2020.

## 2020-12-01 ENCOUNTER — Other Ambulatory Visit: Payer: Self-pay

## 2020-12-01 ENCOUNTER — Ambulatory Visit (INDEPENDENT_AMBULATORY_CARE_PROVIDER_SITE_OTHER): Payer: 59

## 2020-12-01 VITALS — BP 122/62 | HR 66 | Resp 16 | Ht 63.0 in | Wt 187.0 lb

## 2020-12-01 DIAGNOSIS — Z30430 Encounter for insertion of intrauterine contraceptive device: Secondary | ICD-10-CM

## 2020-12-01 LAB — POCT URINE PREGNANCY: Preg Test, Ur: NEGATIVE

## 2020-12-01 MED ORDER — LEVONORGESTREL 20 MCG/DAY IU IUD
1.0000 | INTRAUTERINE_SYSTEM | Freq: Once | INTRAUTERINE | Status: AC
Start: 2020-12-01 — End: 2020-12-01
  Administered 2020-12-01: 1 via INTRAUTERINE

## 2020-12-01 NOTE — Progress Notes (Signed)
History:  [name] is a 35 y.o. [id number] who presents to clinic today for IUD insertion. She desires a form of contraception that doesn't require much thought. Is concerned that she will not take pills consistently. Periods are regular, lasting 7 days with 2-3 heavy days in the middle. She reports her periods have improved since her last delivery   The following portions of the patient's history were reviewed and updated as appropriate: allergies, current medications, family history, past medical history, social history, past surgical history and problem list.  Review of Systems:  Review of Systems  Constitutional: Negative.  Negative for chills and fever.  Respiratory: Negative.    Cardiovascular: Negative.  Negative for chest pain.  Genitourinary: Negative.   Neurological: Negative.  Negative for dizziness and headaches.     Objective:  Physical Exam BP 122/62   Pulse 66   Resp 16   Ht 5\' 3"  (1.6 m)   Wt 187 lb (84.8 kg)   LMP 11/16/2020   Breastfeeding Yes   BMI 33.13 kg/m  Physical Exam Vitals and nursing note reviewed.  Constitutional:      General: She is not in acute distress.    Appearance: She is well-developed.  HENT:     Head: Normocephalic.  Eyes:     Pupils: Pupils are equal, round, and reactive to light.  Cardiovascular:     Rate and Rhythm: Normal rate and regular rhythm.  Pulmonary:     Effort: Pulmonary effort is normal. No respiratory distress.     Breath sounds: Normal breath sounds.  Abdominal:     Palpations: Abdomen is soft.     Tenderness: There is no abdominal tenderness.  Musculoskeletal:        General: Normal range of motion.     Cervical back: Normal range of motion.  Skin:    General: Skin is warm and dry.  Neurological:     Mental Status: She is alert and oriented to person, place, and time.  Psychiatric:        Behavior: Behavior normal.        Thought Content: Thought content normal.        Judgment: Judgment normal.      Labs and Imaging Results for orders placed or performed in visit on 12/01/20 (from the past 24 hour(s))  POCT urine pregnancy     Status: Normal   Collection Time: 12/01/20 10:37 AM  Result Value Ref Range   Preg Test, Ur Negative Negative   IUD Insertion Procedure Note Patient identified, informed consent performed, consent signed.   Discussed risks of irregular bleeding, cramping, infection, malpositioning or misplacement of the IUD outside the uterus which may require further procedure such as laparoscopy. Also discussed >99% contraception efficacy, increased risk of ectopic pregnancy with failure of method.   Emphasized that this did not protect against STIs, condoms recommended during all sexual encounters. Time out was performed.  Urine pregnancy test negative.  Speculum placed in the vagina.  Cervix visualized.  Cleaned with Betadine x 2.  Uterus sounded to 8 cm.  Mirena IUD placed per manufacturer's recommendations.  Strings trimmed to 3 cm.   Patient tolerated procedure well.   Patient was given post-procedure instructions.  She was advised to have backup contraception for one week.  Patient was also asked to check IUD strings periodically and follow up in 4 weeks for IUD check.  Assessment & Plan:  1. Encounter for IUD insertion - POCT urine pregnancy -Types of IUDs  reviewed and patient desires Mirena. Placed without difficulty and post procedure instructions reviewed.    Rolm Bookbinder, [place] 12/01/2020 10:54 AM

## 2020-12-01 NOTE — Addendum Note (Signed)
Addended by: Granville Lewis on: 12/01/2020 11:03 AM   Modules accepted: Orders

## 2020-12-18 ENCOUNTER — Telehealth: Payer: Self-pay | Admitting: *Deleted

## 2020-12-18 NOTE — Telephone Encounter (Signed)
Pt called stating that she did get the IUD placed 10/28 in the office PP.  She started light red bleeding on 12/08/20 and has been bleeding every since.  I explained that it is normal to have irregular bleeding for 1-2 months into getting the IUD placed.  I offered an appt sooner for IUD string check or she could try using Ibuprofen to help with the bleeding.  She opts to wait until her appt at the end of the month.  She will call for appt if she changes her mind.  She has stopped breast feeding.

## 2021-01-02 ENCOUNTER — Encounter: Payer: Self-pay | Admitting: Advanced Practice Midwife

## 2021-01-02 ENCOUNTER — Ambulatory Visit (INDEPENDENT_AMBULATORY_CARE_PROVIDER_SITE_OTHER): Payer: 59 | Admitting: Advanced Practice Midwife

## 2021-01-02 ENCOUNTER — Other Ambulatory Visit: Payer: Self-pay

## 2021-01-02 VITALS — BP 116/71 | HR 73 | Resp 16 | Ht 63.0 in | Wt 188.0 lb

## 2021-01-02 DIAGNOSIS — Z975 Presence of (intrauterine) contraceptive device: Secondary | ICD-10-CM | POA: Insufficient documentation

## 2021-01-02 DIAGNOSIS — N921 Excessive and frequent menstruation with irregular cycle: Secondary | ICD-10-CM | POA: Diagnosis not present

## 2021-01-02 NOTE — Progress Notes (Signed)
   GYNECOLOGY CLINIC PROGRESS NOTE  History:  35 y.o. [id number] here at [facility] today for today for IUD string check; Liletta IUD was placed  100/28/22. She reports onset of menses on 12/08/20 and bleeding every day since then.    The following portions of the patient's history were reviewed and updated as appropriate: allergies, current medications, past family history, past medical history, past social history, past surgical history and problem list. Last pap smear on 05/14/19 was normal, negative HRHPV.  Review of Systems:  Pertinent items are noted in HPI.   Objective:  Physical Exam Blood pressure 116/71, pulse 73, resp. rate 16, height 5\' 3"  (1.6 m), weight 188 lb (85.3 kg), currently breastfeeding. Gen: NAD Abd: Soft, nontender and nondistended Pelvic: Normal appearing external genitalia; normal appearing vaginal mucosa and cervix.  IUD strings visualized, about 3 cm in length outside cervix.   Assessment & Plan:  1. Breakthrough bleeding associated with intrauterine device (IUD) --Discussed options, including OCPs. Pt does not desire estrogen as she has family hx of estrogen sensitive cancers.  She has Micronor at home x 3 month supply that she did not take.  Pt prefers to try Micronor daily x 28 days, then stop until light menses occurs. If frequent bleeding resumes, she can repeat 28 days of Micronor.   --If Micronor not effective, consider Slynd with higher dose of progesterone before using estrogen at pt request.  --F/U in 1 month in office  Normal IUD check. Patient to keep IUD in place for five to seven years; can come in for removal if she desires pregnancy within the next seven years. Routine preventative health maintenance measures emphasized.  , CNM 2:23 PM

## 2021-01-30 ENCOUNTER — Ambulatory Visit: Payer: 59 | Admitting: Advanced Practice Midwife

## 2021-01-30 ENCOUNTER — Telehealth: Payer: Self-pay | Admitting: *Deleted

## 2021-01-30 NOTE — Telephone Encounter (Signed)
Returned call from 11:11 AM. Patient missed 9:50 AM appointment due to illness, but did not call until 11:11 AM. Patient was informed that she is subject to a $50.00 no show fee, patient understood and will call back to reschedule when she is feeling better.

## 2021-02-09 ENCOUNTER — Encounter: Payer: Self-pay | Admitting: Advanced Practice Midwife

## 2021-02-22 ENCOUNTER — Encounter: Payer: Self-pay | Admitting: Registered Nurse

## 2021-02-22 ENCOUNTER — Ambulatory Visit (INDEPENDENT_AMBULATORY_CARE_PROVIDER_SITE_OTHER): Payer: 59 | Admitting: Registered Nurse

## 2021-02-22 ENCOUNTER — Telehealth: Payer: Self-pay | Admitting: Registered Nurse

## 2021-02-22 VITALS — BP 100/72 | HR 63 | Temp 98.2°F | Ht 63.0 in | Wt 183.2 lb

## 2021-02-22 DIAGNOSIS — M25552 Pain in left hip: Secondary | ICD-10-CM | POA: Diagnosis not present

## 2021-02-22 DIAGNOSIS — Z8349 Family history of other endocrine, nutritional and metabolic diseases: Secondary | ICD-10-CM | POA: Diagnosis not present

## 2021-02-22 DIAGNOSIS — D561 Beta thalassemia: Secondary | ICD-10-CM

## 2021-02-22 DIAGNOSIS — J029 Acute pharyngitis, unspecified: Secondary | ICD-10-CM

## 2021-02-22 DIAGNOSIS — G8929 Other chronic pain: Secondary | ICD-10-CM

## 2021-02-22 DIAGNOSIS — M545 Low back pain, unspecified: Secondary | ICD-10-CM

## 2021-02-22 DIAGNOSIS — M542 Cervicalgia: Secondary | ICD-10-CM

## 2021-02-22 DIAGNOSIS — Z820 Family history of epilepsy and other diseases of the nervous system: Secondary | ICD-10-CM | POA: Diagnosis not present

## 2021-02-22 DIAGNOSIS — Z13220 Encounter for screening for lipoid disorders: Secondary | ICD-10-CM

## 2021-02-22 DIAGNOSIS — R5382 Chronic fatigue, unspecified: Secondary | ICD-10-CM

## 2021-02-22 DIAGNOSIS — R413 Other amnesia: Secondary | ICD-10-CM

## 2021-02-22 LAB — COMPREHENSIVE METABOLIC PANEL
ALT: 10 U/L (ref 0–35)
AST: 14 U/L (ref 0–37)
Albumin: 4.8 g/dL (ref 3.5–5.2)
Alkaline Phosphatase: 53 U/L (ref 39–117)
BUN: 10 mg/dL (ref 6–23)
CO2: 24 mEq/L (ref 19–32)
Calcium: 9.2 mg/dL (ref 8.4–10.5)
Chloride: 104 mEq/L (ref 96–112)
Creatinine, Ser: 0.64 mg/dL (ref 0.40–1.20)
GFR: 114.71 mL/min (ref >60.00)
Glucose, Bld: 93 mg/dL (ref 70–99)
Potassium: 4 mEq/L (ref 3.5–5.1)
Sodium: 137 mEq/L (ref 135–145)
Total Bilirubin: 0.8 mg/dL (ref 0.2–1.2)
Total Protein: 7.4 g/dL (ref 6.0–8.3)

## 2021-02-22 LAB — CBC WITH DIFFERENTIAL/PLATELET
Basophils Absolute: 0 10*3/uL (ref 0.0–0.1)
Basophils Relative: 0.5 % (ref 0.0–3.0)
Eosinophils Absolute: 0.2 10*3/uL (ref 0.0–0.7)
Eosinophils Relative: 2.6 % (ref 0.0–5.0)
HCT: 37.4 % (ref 36.0–46.0)
Hemoglobin: 11.7 g/dL — ABNORMAL LOW (ref 12.0–15.0)
Lymphocytes Relative: 26.4 % (ref 12.0–46.0)
Lymphs Abs: 2.1 10*3/uL (ref 0.7–4.0)
MCHC: 31.3 g/dL (ref 30.0–36.0)
MCV: 59.8 fl — ABNORMAL LOW (ref 78.0–100.0)
Monocytes Absolute: 0.4 10*3/uL (ref 0.1–1.0)
Monocytes Relative: 4.5 % (ref 3.0–12.0)
Neutro Abs: 5.3 10*3/uL (ref 1.4–7.7)
Neutrophils Relative %: 66 % (ref 43.0–77.0)
Platelets: 272 10*3/uL (ref 150.0–400.0)
RBC: 6.26 Mil/uL — ABNORMAL HIGH (ref 3.87–5.11)
RDW: 15.5 % (ref 11.5–15.5)
WBC: 8.1 10*3/uL (ref 4.0–10.5)

## 2021-02-22 LAB — TSH: TSH: 1.52 u[IU]/mL (ref 0.35–5.50)

## 2021-02-22 LAB — B12 AND FOLATE PANEL
Folate: >24.2 ng/mL (ref >5.9)
Vitamin B-12: 414 pg/mL (ref 211–911)

## 2021-02-22 LAB — T4, FREE: Free T4: 0.82 ng/dL (ref 0.60–1.60)

## 2021-02-22 LAB — POCT RAPID STREP A (OFFICE): Rapid Strep A Screen: NEGATIVE

## 2021-02-22 LAB — LIPID PANEL
Cholesterol: 158 mg/dL (ref 0–200)
HDL: 45.2 mg/dL (ref >39.00)
LDL Cholesterol: 94 mg/dL (ref 0–99)
NonHDL: 113.29
Total CHOL/HDL Ratio: 4
Triglycerides: 94 mg/dL (ref 0.0–149.0)
VLDL: 18.8 mg/dL (ref 0.0–40.0)

## 2021-02-22 LAB — HEMOGLOBIN A1C: Hgb A1c MFr Bld: 5.5 % (ref 4.6–6.5)

## 2021-02-22 LAB — VITAMIN D 25 HYDROXY (VIT D DEFICIENCY, FRACTURES): VITD: 24.7 ng/mL — ABNORMAL LOW (ref 30.00–100.00)

## 2021-02-22 NOTE — Patient Instructions (Signed)
Ms Symantha Steeber to meet you! (Both of you!)  Labs today will be back in coming days.  Xrays at [address] - they take walk ins.  Genetic counseling will call you to set up appt   Call me if you need anything!  Thanks,  Luan Pulling

## 2021-02-22 NOTE — Addendum Note (Signed)
Addended by: Sandy Salaam on: 02/22/2021 12:17 PM   Modules accepted: Orders

## 2021-02-22 NOTE — Telephone Encounter (Signed)
Can we changed the orders for xray to [place],  [facility] doesn't take her insurance
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
## 2021-02-22 NOTE — Telephone Encounter (Signed)
Orders have been changed

## 2021-02-23 ENCOUNTER — Ambulatory Visit (INDEPENDENT_AMBULATORY_CARE_PROVIDER_SITE_OTHER)
Admission: RE | Admit: 2021-02-23 | Discharge: 2021-02-23 | Disposition: A | Discharge status: Home / Self Care | Payer: 59 | Source: Ambulatory Visit | Attending: Registered Nurse | Admitting: Registered Nurse

## 2021-02-23 ENCOUNTER — Other Ambulatory Visit: Payer: Self-pay

## 2021-02-23 DIAGNOSIS — M545 Low back pain, unspecified: Secondary | ICD-10-CM | POA: Diagnosis not present

## 2021-02-23 DIAGNOSIS — G8929 Other chronic pain: Secondary | ICD-10-CM | POA: Diagnosis not present

## 2021-02-23 DIAGNOSIS — M25552 Pain in left hip: Secondary | ICD-10-CM | POA: Diagnosis not present

## 2021-02-23 DIAGNOSIS — M542 Cervicalgia: Secondary | ICD-10-CM | POA: Diagnosis not present

## 2021-02-23 IMAGING — DX DG HIP (WITH OR WITHOUT PELVIS) 2-3V*L*
3 series · 3 of 3 positions shown · non-contrast
Comparison: None.

CLINICAL DATA: Chronic hip pain.

EXAM:
DG HIP (WITH OR WITHOUT PELVIS) 2-3V LEFT

[pelvis ap]
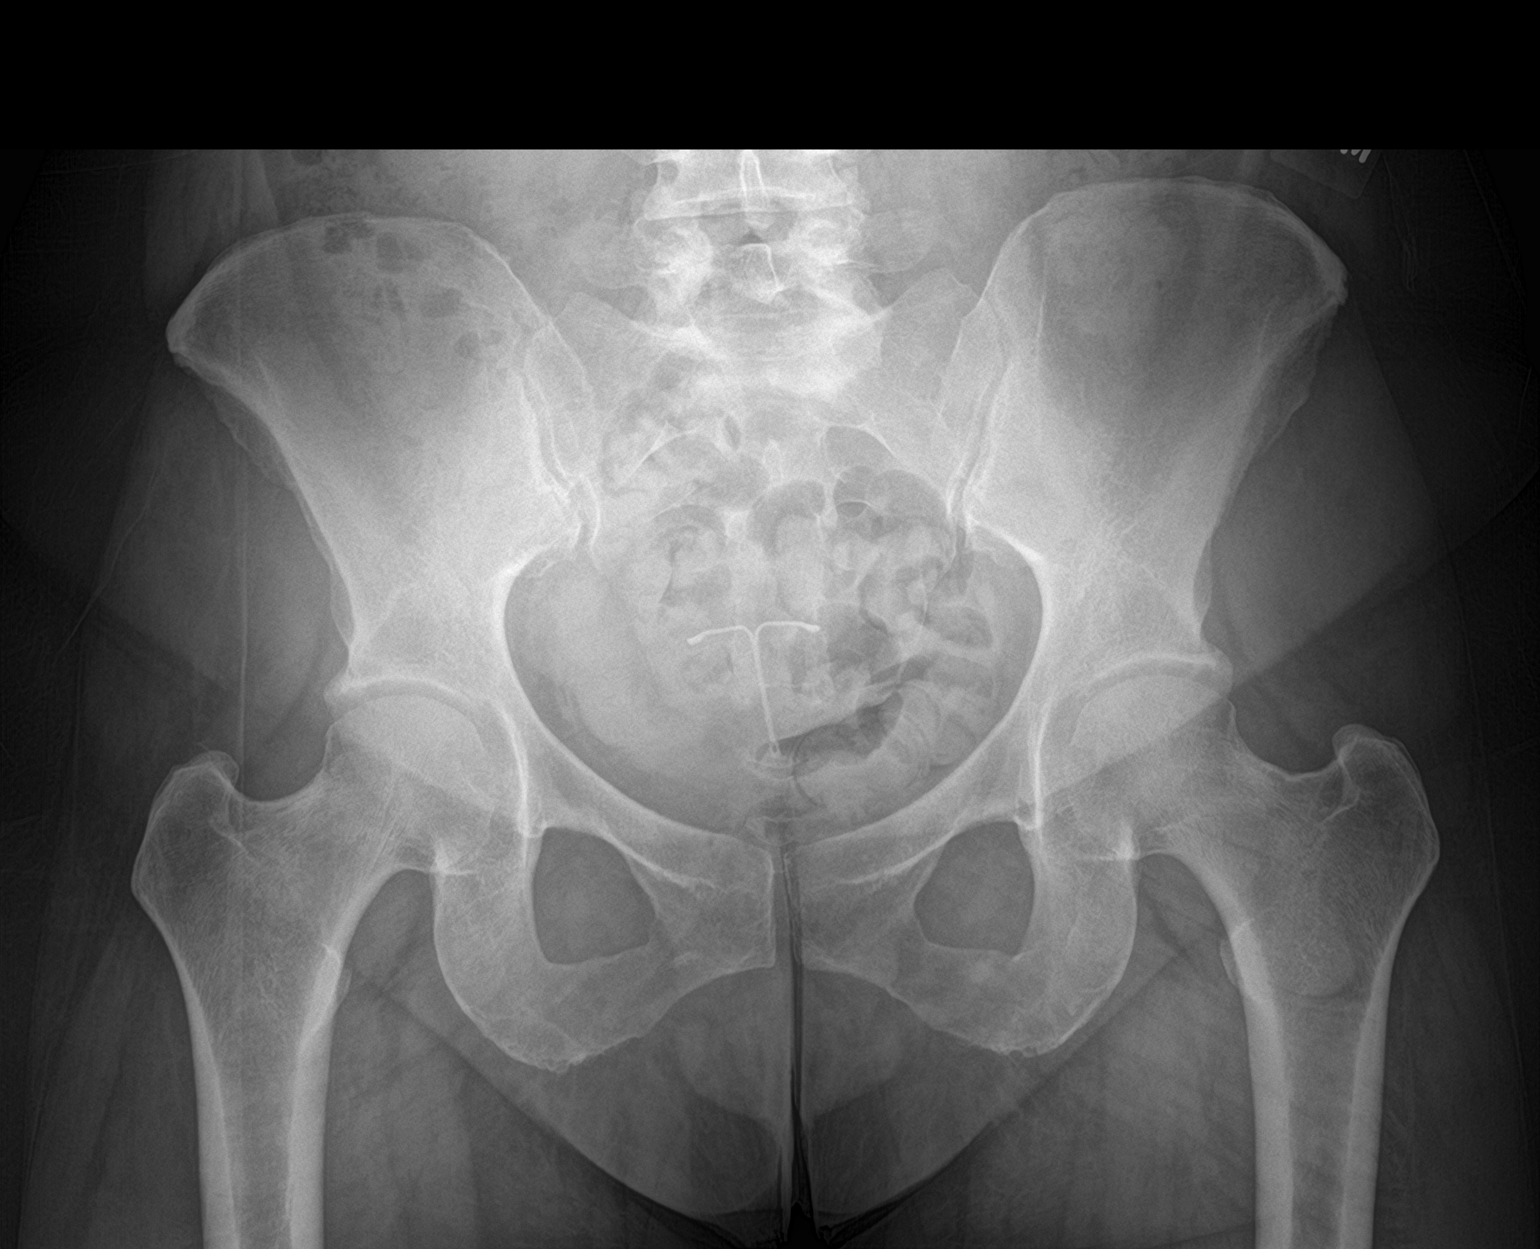

[hip ap]
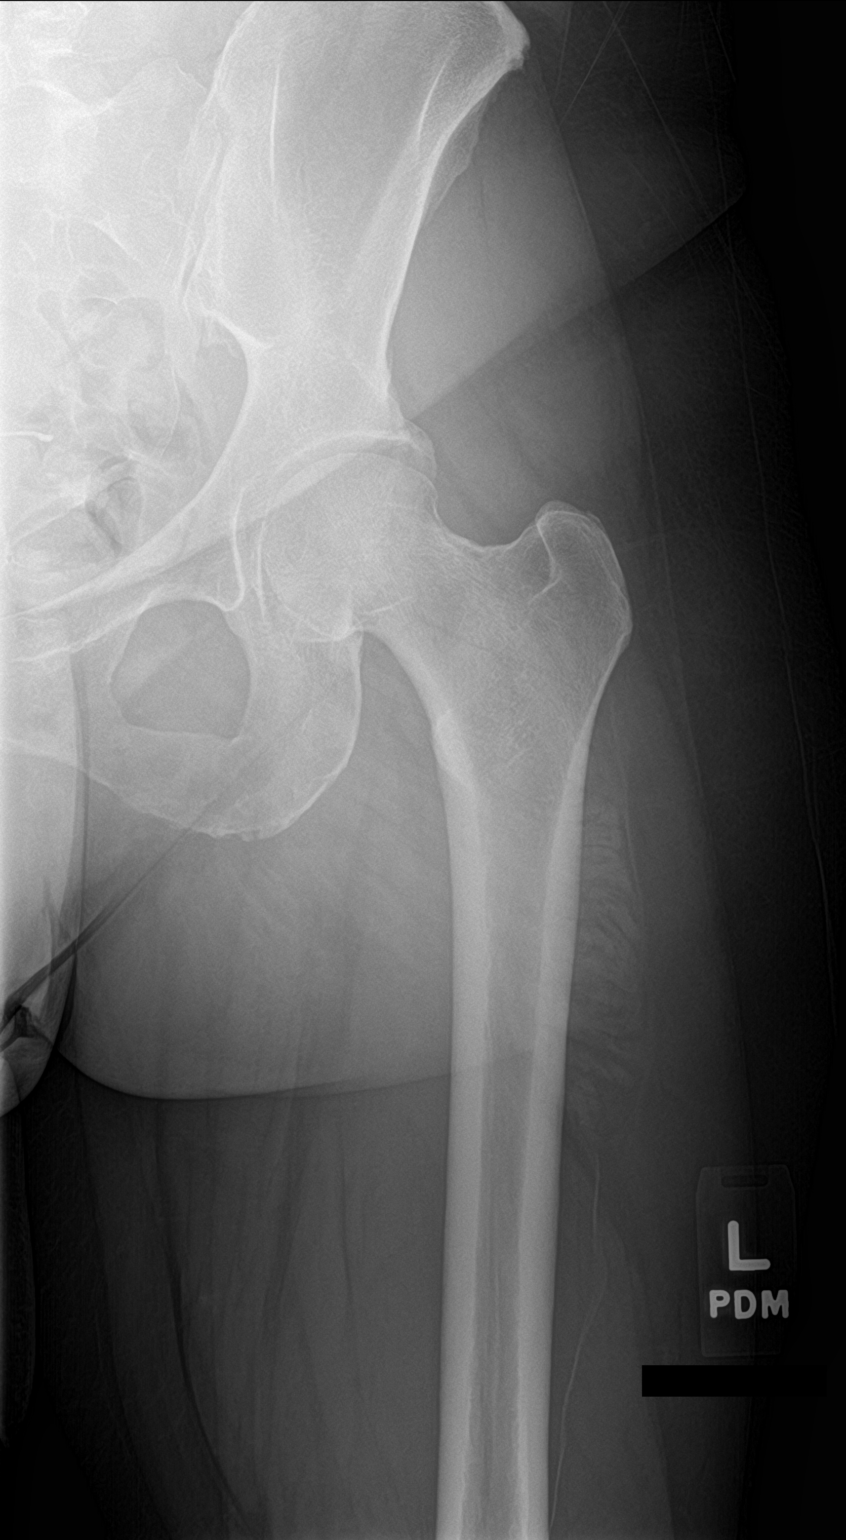

[hip lat]
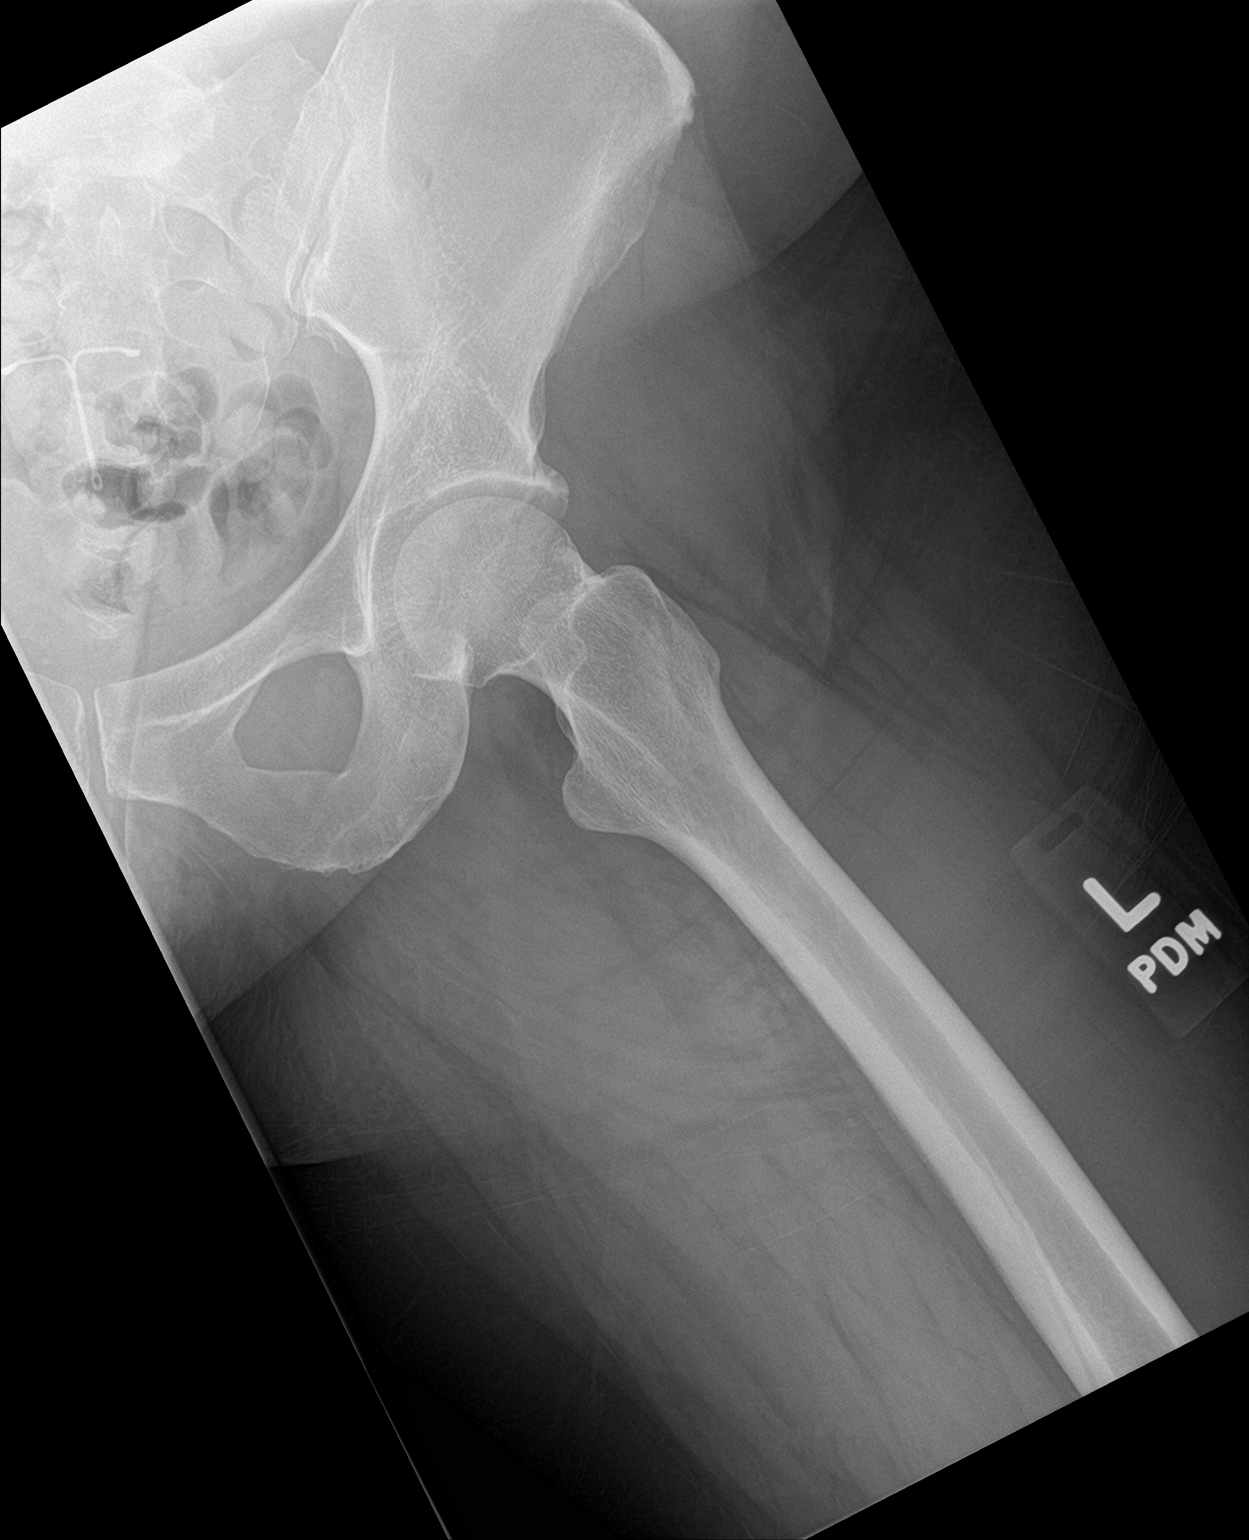

[3 of 3 positions shown; findings below may reference images not displayed]

FINDINGS: There is diffuse decreased bone mineralization. An IUD overlies the
midline pelvis. Minimal superomedial left femoroacetabular joint
space narrowing. Mild superior left acetabular degenerative
osteophytosis. The bilateral sacroiliac and pubic symphysis joint
spaces are maintained. No acute fracture is seen. No dislocation.
IMPRESSION: Very mild left femoroacetabular osteoarthritis.

## 2021-02-24 LAB — ANA: Anti Nuclear Antibody (ANA): NEGATIVE

## 2021-03-01 ENCOUNTER — Other Ambulatory Visit: Payer: Self-pay | Admitting: Registered Nurse

## 2021-03-01 DIAGNOSIS — Z820 Family history of epilepsy and other diseases of the nervous system: Secondary | ICD-10-CM

## 2021-03-14 ENCOUNTER — Encounter: Payer: Self-pay | Admitting: Registered Nurse

## 2021-03-14 NOTE — Telephone Encounter (Signed)
Patient was just giving you a update that PT would be 150.00 per week with insurance and she will probably just do some stretches and exercises at home.

## 2021-03-19 NOTE — Telephone Encounter (Signed)
Noted  Thanks,  Rich

## 2021-06-12 ENCOUNTER — Ambulatory Visit: Payer: 59

## 2021-06-12 VITALS — BP 128/81 | HR 60 | Ht 63.0 in | Wt 183.0 lb

## 2021-06-12 DIAGNOSIS — N939 Abnormal uterine and vaginal bleeding, unspecified: Secondary | ICD-10-CM

## 2021-06-12 NOTE — Progress Notes (Signed)
? ?  GYNECOLOGY PROGRESS NOTE ? ?History:  ?36 y.o. G2P2002 presents to [facility] office today for problem gyn visit. She reports on Friday she had an episode of sharp abdominal pain for about 10 mins. Pain was so debilitating that she had difficulty walking/moving. Pain went away and she felt fine all of Saturday until about 9:30-10pm when she started to feel febrile. First took temp which was 99.5 then got worse and spiked to 102. Felt achy, but otherwise had no other symptoms. On Sunday she woke up and felt completely normal. On Monday she reports she went to the bathroom to pee and passed a small amount of tissue. She did start having very light spotting this morning, but not enough to wear a pad. Took a pregnancy test at home and saw a faint line. Concerned about possible miscarriage. Has a Mirena IUD which was placed in October. ? ?The following portions of the patient's history were reviewed and updated as appropriate: allergies, current medications, past family history, past medical history, past social history, past surgical history and problem list.  ? ?Health Maintenance Due  ?Topic Date Due  ? COVID-19 Vaccine (1) Never done  ?  ? ?Review of Systems:  ?Pertinent items are noted in HPI. ?  ?Objective:  ?Physical Exam ?Blood pressure 128/81, pulse 60, height 5\' 3"  (1.6 m), weight 183 lb (83 kg), currently breastfeeding. ?VS reviewed, nursing note reviewed,  ?Constitutional: well developed, well nourished, no distress ?HEENT: normocephalic ?CV: normal rate ?Pulm/chest wall: normal effort ?Breast Exam: deferred ?Abdomen: soft ?Neuro: alert and oriented x 3 ?Skin: warm, dry ?Psych: affect normal ?Pelvic exam: Cervix pink, visually closed, without lesion, 2 IUD strings visualized, scant blood-tinged mucoid discharge, vaginal walls and external genitalia normal ?Bimanual exam: Cervix 0/long/high, firm, anterior, neg CMT, uterus nontender, nonenlarged, adnexa without tenderness, enlargement, or mass ? ?Assessment &  Plan:  ?1. Vaginal bleeding ?- IUD in place ?- Will draw bHCG given possible faint positive UPT at home and passing ?tissue ?- Unsure of why patient had fever given no other symptoms and no one in the household was sick, but likely viral process  ? ?- B-HCG Quant ? ? ?No follow-ups on file.  ? ? , CNM ?8:10 AM ? ?

## 2021-06-13 LAB — HCG, QUANTITATIVE, PREGNANCY: HCG, Total, QN: <5 m[IU]/mL

## 2022-02-20 ENCOUNTER — Ambulatory Visit: Payer: Self-pay
